# Patient Record
Sex: Female | Born: 1945 | Race: White | Hispanic: No | Smoking: Former smoker
Health system: Southern US, Community
[De-identification: ages and names within clinical notes are randomized; demographics above are authoritative.]

## PROBLEM LIST (undated history)

## (undated) DIAGNOSIS — C44329 Squamous cell carcinoma of skin of other parts of face: Secondary | ICD-10-CM

## (undated) DIAGNOSIS — D649 Anemia, unspecified: Secondary | ICD-10-CM

## (undated) DIAGNOSIS — M3500 Sicca syndrome, unspecified: Secondary | ICD-10-CM

## (undated) DIAGNOSIS — M8580 Other specified disorders of bone density and structure, unspecified site: Secondary | ICD-10-CM

## (undated) DIAGNOSIS — C4491 Basal cell carcinoma of skin, unspecified: Secondary | ICD-10-CM

## (undated) DIAGNOSIS — C50919 Malignant neoplasm of unspecified site of unspecified female breast: Secondary | ICD-10-CM

## (undated) HISTORY — DX: Sjogren syndrome, unspecified: M35.00

## (undated) HISTORY — DX: Basal cell carcinoma of skin, unspecified: C44.91

## (undated) HISTORY — PX: MASTECTOMY: SHX3

## (undated) HISTORY — PX: CATARACT EXTRACTION, BILATERAL: SHX1313

## (undated) HISTORY — DX: Anemia, unspecified: D64.9

## (undated) HISTORY — DX: Malignant neoplasm of unspecified site of unspecified female breast: C50.919

## (undated) HISTORY — DX: Squamous cell carcinoma of skin of other parts of face: C44.329

## (undated) HISTORY — DX: Other specified disorders of bone density and structure, unspecified site: M85.80

---

## 2018-10-08 LAB — HM DEXA SCAN

## 2021-06-14 ENCOUNTER — Ambulatory Visit (INDEPENDENT_AMBULATORY_CARE_PROVIDER_SITE_OTHER): Payer: Medicare Other | Admitting: Internal Medicine

## 2021-06-14 ENCOUNTER — Ambulatory Visit (INDEPENDENT_AMBULATORY_CARE_PROVIDER_SITE_OTHER): Payer: Medicare Other

## 2021-06-14 ENCOUNTER — Encounter: Payer: Self-pay | Admitting: Internal Medicine

## 2021-06-14 ENCOUNTER — Encounter (INDEPENDENT_AMBULATORY_CARE_PROVIDER_SITE_OTHER): Payer: Self-pay

## 2021-06-14 ENCOUNTER — Other Ambulatory Visit: Payer: Self-pay

## 2021-06-14 VITALS — BP 144/70 | HR 78 | Temp 98.5°F | Ht 69.25 in | Wt 122.6 lb

## 2021-06-14 DIAGNOSIS — I10 Essential (primary) hypertension: Secondary | ICD-10-CM | POA: Diagnosis not present

## 2021-06-14 DIAGNOSIS — D692 Other nonthrombocytopenic purpura: Secondary | ICD-10-CM | POA: Diagnosis not present

## 2021-06-14 DIAGNOSIS — R778 Other specified abnormalities of plasma proteins: Secondary | ICD-10-CM

## 2021-06-14 DIAGNOSIS — R5382 Chronic fatigue, unspecified: Secondary | ICD-10-CM | POA: Insufficient documentation

## 2021-06-14 DIAGNOSIS — R058 Other specified cough: Secondary | ICD-10-CM | POA: Insufficient documentation

## 2021-06-14 DIAGNOSIS — R0989 Other specified symptoms and signs involving the circulatory and respiratory systems: Secondary | ICD-10-CM | POA: Insufficient documentation

## 2021-06-14 DIAGNOSIS — J22 Unspecified acute lower respiratory infection: Secondary | ICD-10-CM | POA: Insufficient documentation

## 2021-06-14 DIAGNOSIS — R911 Solitary pulmonary nodule: Secondary | ICD-10-CM

## 2021-06-14 LAB — URINALYSIS, ROUTINE W REFLEX MICROSCOPIC
Bilirubin Urine: NEGATIVE
Hgb urine dipstick: NEGATIVE
Ketones, ur: NEGATIVE
Leukocytes,Ua: NEGATIVE
Nitrite: NEGATIVE
RBC / HPF: NONE SEEN (ref 0–?)
Specific Gravity, Urine: <=1.005 — AB (ref 1.000–1.030)
Total Protein, Urine: NEGATIVE
Urine Glucose: NEGATIVE
Urobilinogen, UA: 0.2 (ref 0.0–1.0)
WBC, UA: NONE SEEN (ref 0–?)
pH: 6.5 (ref 5.0–8.0)

## 2021-06-14 LAB — HEPATIC FUNCTION PANEL
ALT: 19 U/L (ref 0–35)
AST: 33 U/L (ref 0–37)
Albumin: 3.5 g/dL (ref 3.5–5.2)
Alkaline Phosphatase: 54 U/L (ref 39–117)
Bilirubin, Direct: 0.1 mg/dL (ref 0.0–0.3)
Total Bilirubin: 0.4 mg/dL (ref 0.2–1.2)
Total Protein: 9.7 g/dL — ABNORMAL HIGH (ref 6.0–8.3)

## 2021-06-14 LAB — BASIC METABOLIC PANEL
BUN: 15 mg/dL (ref 6–23)
CO2: 30 mEq/L (ref 19–32)
Calcium: 9.6 mg/dL (ref 8.4–10.5)
Chloride: 94 mEq/L — ABNORMAL LOW (ref 96–112)
Creatinine, Ser: 0.72 mg/dL (ref 0.40–1.20)
GFR: 82.71 mL/min (ref >60.00)
Glucose, Bld: 92 mg/dL (ref 70–99)
Potassium: 4.3 mEq/L (ref 3.5–5.1)
Sodium: 129 mEq/L — ABNORMAL LOW (ref 135–145)

## 2021-06-14 LAB — CBC WITH DIFFERENTIAL/PLATELET
Basophils Absolute: 0 10*3/uL (ref 0.0–0.1)
Basophils Relative: 0.7 % (ref 0.0–3.0)
Eosinophils Absolute: 0 10*3/uL (ref 0.0–0.7)
Eosinophils Relative: 0.8 % (ref 0.0–5.0)
HCT: 31.7 % — ABNORMAL LOW (ref 36.0–46.0)
Hemoglobin: 10.5 g/dL — ABNORMAL LOW (ref 12.0–15.0)
Lymphocytes Relative: 19 % (ref 12.0–46.0)
Lymphs Abs: 1 10*3/uL (ref 0.7–4.0)
MCHC: 33.3 g/dL (ref 30.0–36.0)
MCV: 93.2 fl (ref 78.0–100.0)
Monocytes Absolute: 0.4 10*3/uL (ref 0.1–1.0)
Monocytes Relative: 7 % (ref 3.0–12.0)
Neutro Abs: 3.9 10*3/uL (ref 1.4–7.7)
Neutrophils Relative %: 72.5 % (ref 43.0–77.0)
Platelets: 295 10*3/uL (ref 150.0–400.0)
RBC: 3.4 Mil/uL — ABNORMAL LOW (ref 3.87–5.11)
RDW: 14.1 % (ref 11.5–15.5)
WBC: 5.4 10*3/uL (ref 4.0–10.5)

## 2021-06-14 LAB — APTT: aPTT: 27.8 s (ref 23.4–32.7)

## 2021-06-14 LAB — TSH: TSH: 2 u[IU]/mL (ref 0.35–5.50)

## 2021-06-14 LAB — PROTIME-INR
INR: 1.1 ratio — ABNORMAL HIGH (ref 0.8–1.0)
Prothrombin Time: 11.6 s (ref 9.6–13.1)

## 2021-06-14 MED ORDER — AMOXICILLIN-POT CLAVULANATE 875-125 MG PO TABS: 1.0000 | ORAL_TABLET | Freq: Two times a day (BID) | ORAL | 0 refills | Status: AC

## 2021-06-14 NOTE — Progress Notes (Signed)
ppur  Subjective:  Patient ID: April Howell, female    DOB: Dec 29, 1945  Age: 75 y.o. MRN: [id number]  CC: Cough  This visit occurred during the SARS-CoV-2 public health emergency.  Safety protocols were in place, including screening questions prior to the visit, additional usage of staff PPE, and extensive cleaning of exam room while observing appropriate contact time as indicated for disinfecting solutions.    HPI April Howell presents for establishing.  She recently moved here from [place].  She complains of a 104-monthhistory of cough that is intermittently productive of green phlegm.  She describes being seen in an urgent care center 3 months ago and was prescribed steroids which were not helpful.  She also complains of fatigue and weight loss.   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   She has a past surgical history that includes Mastectomy (Right).   Her family history includes Basal cell carcinoma in her sister; Breast cancer in her sister; Heart attack in her father; Heart disease in her father; Hodgkin's lymphoma in her sister; Multiple sclerosis in her mother.She reports that she quit smoking about 50 years ago. Her smoking use included cigarettes. She has never used smokeless tobacco. She reports that she does not currently use alcohol after a past usage of about 7.0 standard drinks per week. She reports that she does not use drugs.  Outpatient Medications Prior to Visit  Medication Sig Dispense Refill   CALCIUM PO Take 650 mg by mouth daily.     Ferrous Sulfate Dried (SLOW RELEASE IRON) 45 MG TBCR Take by mouth.     Multiple Vitamin (MULTIVITAMIN) tablet Take 1 tablet by mouth daily.     Omega-3 Fatty Acids (FISH OIL) 1000 MG CAPS Take by mouth.     No facility-administered medications prior to visit.    ROS Review of Systems  Constitutional:  Positive for fatigue and  unexpected weight change. Negative for chills, diaphoresis and fever.  HENT: Negative.    Eyes: Negative.   Respiratory:  Positive for cough. Negative for chest tightness, shortness of breath and wheezing.   Cardiovascular:  Negative for chest pain, palpitations and leg swelling.  Gastrointestinal:  Negative for abdominal pain, constipation, diarrhea, nausea and vomiting.  Endocrine: Negative.   Genitourinary:  Negative for decreased urine volume and difficulty urinating.  Musculoskeletal:  Positive for arthralgias.  Skin: Negative.   Neurological: Negative.  Negative for dizziness and weakness.  Hematological:  Negative for adenopathy. Bruises/bleeds easily.  Psychiatric/Behavioral: Negative.     Objective:  BP (!) 144/70 (BP Location: Right Arm, Patient Position: Sitting, Cuff Size: Large)   Pulse 78   Temp 98.5 F (36.9 C) (Oral)   Ht 5' 9.25" (1.759 m)   Wt 122 lb 9.6 oz (55.6 kg)   SpO2 99%   BMI 17.97 kg/m   Physical Exam Vitals reviewed.  Constitutional:      Appearance: She is ill-appearing. She is not diaphoretic.  HENT:     Nose: Nose normal.     Mouth/Throat:     Mouth: Mucous membranes are moist. Mucous membranes are pale.  Eyes:     General: No scleral icterus.    Conjunctiva/sclera: Conjunctivae normal.  Cardiovascular:     Rate and Rhythm: Normal rate and regular rhythm.     Heart sounds: No murmur heard. Pulmonary:     Effort: Pulmonary effort is normal.  Breath sounds: No stridor. No wheezing, rhonchi or rales.  Abdominal:     General: Abdomen is flat. Bowel sounds are normal. There is no abdominal bruit.     Palpations: There is no mass.     Tenderness: There is no abdominal tenderness. There is no guarding.     Hernia: No hernia is present.     Comments: The aortic pulse is palpable but there is no bruit.  Musculoskeletal:        General: Normal range of motion.     Cervical back: Neck supple.     Right lower leg: No edema.     Left lower leg:  No edema.  Lymphadenopathy:     Cervical: No cervical adenopathy.  Skin:    General: Skin is warm and dry.     Coloration: Skin is pale.     Comments: Over both anterior lower legs there are scattered areas of nonpalpable petechiae and purpura  Neurological:     General: No focal deficit present.     Mental Status: She is alert.  Psychiatric:        Mood and Affect: Mood normal.    Lab Results  Component Value Date   WBC 5.4 06/14/2021   HGB 10.5 (L) 06/14/2021   HCT 31.7 (L) 06/14/2021   PLT 295.0 06/14/2021   GLUCOSE 92 06/14/2021   ALT 19 06/14/2021   AST 33 06/14/2021   NA 129 (L) 06/14/2021   K 4.3 06/14/2021   CL 94 (L) 06/14/2021   CREATININE 0.72 06/14/2021   BUN 15 06/14/2021   CO2 30 06/14/2021   TSH 2.00 06/14/2021   INR 1.1 (H) 06/14/2021    DG Chest 2 View  Result Date: 06/14/2021 CLINICAL DATA:  Productive cough for 3 months. EXAM: CHEST - 2 VIEW COMPARISON:  None. FINDINGS: The heart size and mediastinal contours are within normal limits. No pneumothorax or pleural effusion is noted. Hyperexpansion of the lungs is noted. Mild left lingular density is noted concerning for possible scarring or inflammation. Irregular nodular density is seen in right upper lobe. The visualized skeletal structures are unremarkable. IMPRESSION: Hyperexpansion of the lungs. Mild left lingular opacity is noted concerning for possible scarring or inflammation. Irregular nodular density is noted in right upper lobe; CT scan of the chest is recommended for further evaluation. Electronically Signed   By: Marijo Conception M.D.   On: 06/14/2021 18:29      Assessment & Plan:   April Howell was seen today for cough.  Diagnoses and all orders for this visit:  Purpura ([place]) -     CBC with Differential/Platelet; Future -     Protime-INR; Future -     APTT; Future -     APTT -     Protime-INR -     CBC with Differential/Platelet  Chronic fatigue- Her labs are significant for anemia.  I have  asked her to return to have this evaluated further. -     CBC with Differential/Platelet; Future -     Basic metabolic panel; Future -     TSH; Future -     TSH -     Basic metabolic panel -     CBC with Differential/Platelet  Primary hypertension- Antihypertensive therapy is not indicated. -     CBC with Differential/Platelet; Future -     Basic metabolic panel; Future -     TSH; Future -     Urinalysis, Routine w reflex microscopic; Future -  Hepatic function panel; Future -     Hepatic function panel -     Urinalysis, Routine w reflex microscopic -     TSH -     Basic metabolic panel -     CBC with Differential/Platelet  Cough productive of purulent sputum -     DG Chest 2 View; Future -     CT Chest W Contrast  Abdominal bruit -     Cancel: VAS [place] AAA DUPLEX; Future  Elevated total protein- She has symptoms, an exam, and the labs that are concerning for malignancy. -     Ambulatory referral to Hematology / Oncology -     CT Chest W Contrast  LRTI (lower respiratory tract infection)- Chest x-ray is suspicious for left pneumonia.  Will treat with Augmentin. -     amoxicillin-clavulanate (AUGMENTIN) 875-125 MG tablet; Take 1 tablet by mouth 2 (two) times daily for 10 days.  Pulse visible in abdominal aorta  Nodule of upper lobe of right lung- I recommended that she undergo a CT with contrast to screen for malignancy. -     CT Chest W Contrast  I am having [facility] start on amoxicillin-clavulanate. I am also having her maintain her CALCIUM PO, Fish Oil, multivitamin, and Slow Release Iron.  Meds ordered this encounter  Medications   amoxicillin-clavulanate (AUGMENTIN) 875-125 MG tablet    Sig: Take 1 tablet by mouth 2 (two) times daily for 10 days.    Dispense:  20 tablet    Refill:  0      Follow-up: Return in about 3 months (around 09/14/2021).  Scarlette Calico, MD

## 2021-06-14 NOTE — Patient Instructions (Signed)

## 2021-06-15 ENCOUNTER — Telehealth: Payer: Self-pay | Admitting: Hematology and Oncology

## 2021-06-15 DIAGNOSIS — R0989 Other specified symptoms and signs involving the circulatory and respiratory systems: Secondary | ICD-10-CM | POA: Insufficient documentation

## 2021-06-15 DIAGNOSIS — R911 Solitary pulmonary nodule: Secondary | ICD-10-CM | POA: Insufficient documentation

## 2021-06-15 NOTE — Telephone Encounter (Signed)
Received a new hem referral from Dr. Ronnald Ramp for Elevated total protein. Pt has been cld and scheduled to see Dr. Lorenso Courier on 9/2 at 1pm. Pt aware to arrive 15 minutes early.

## 2021-06-17 ENCOUNTER — Encounter: Payer: Self-pay | Admitting: Internal Medicine

## 2021-06-19 ENCOUNTER — Other Ambulatory Visit: Payer: Self-pay | Admitting: Internal Medicine

## 2021-06-19 ENCOUNTER — Telehealth: Payer: Self-pay | Admitting: Internal Medicine

## 2021-06-19 DIAGNOSIS — R778 Other specified abnormalities of plasma proteins: Secondary | ICD-10-CM

## 2021-06-19 DIAGNOSIS — D539 Nutritional anemia, unspecified: Secondary | ICD-10-CM | POA: Insufficient documentation

## 2021-06-19 NOTE — Telephone Encounter (Signed)
Provider sent patient mychart message advising patient to come in sometime this week to be screened for vitamin deficiencies but no orders are showing in patient chart  Please advise patient when she can come in to have test done

## 2021-06-20 ENCOUNTER — Other Ambulatory Visit (INDEPENDENT_AMBULATORY_CARE_PROVIDER_SITE_OTHER): Payer: Medicare Other

## 2021-06-20 DIAGNOSIS — D539 Nutritional anemia, unspecified: Secondary | ICD-10-CM | POA: Diagnosis not present

## 2021-06-20 DIAGNOSIS — R778 Other specified abnormalities of plasma proteins: Secondary | ICD-10-CM

## 2021-06-20 LAB — CBC WITH DIFFERENTIAL/PLATELET
Basophils Absolute: 0 10*3/uL (ref 0.0–0.1)
Basophils Relative: 0.7 % (ref 0.0–3.0)
Eosinophils Absolute: 0 10*3/uL (ref 0.0–0.7)
Eosinophils Relative: 0.8 % (ref 0.0–5.0)
HCT: 32.7 % — ABNORMAL LOW (ref 36.0–46.0)
Hemoglobin: 10.9 g/dL — ABNORMAL LOW (ref 12.0–15.0)
Lymphocytes Relative: 23.6 % (ref 12.0–46.0)
Lymphs Abs: 1 10*3/uL (ref 0.7–4.0)
MCHC: 33.3 g/dL (ref 30.0–36.0)
MCV: 92.6 fl (ref 78.0–100.0)
Monocytes Absolute: 0.3 10*3/uL (ref 0.1–1.0)
Monocytes Relative: 8 % (ref 3.0–12.0)
Neutro Abs: 2.7 10*3/uL (ref 1.4–7.7)
Neutrophils Relative %: 66.9 % (ref 43.0–77.0)
Platelets: 291 10*3/uL (ref 150.0–400.0)
RBC: 3.54 Mil/uL — ABNORMAL LOW (ref 3.87–5.11)
RDW: 13.8 % (ref 11.5–15.5)
WBC: 4 10*3/uL (ref 4.0–10.5)

## 2021-06-20 LAB — VITAMIN B12: Vitamin B-12: 881 pg/mL (ref 211–911)

## 2021-06-20 LAB — IBC + FERRITIN
Ferritin: 33.3 ng/mL (ref 10.0–291.0)
Iron: 114 ug/dL (ref 42–145)
Saturation Ratios: 30.4 % (ref 20.0–50.0)
TIBC: 375.2 ug/dL (ref 250.0–450.0)
Transferrin: 268 mg/dL (ref 212.0–360.0)

## 2021-06-20 LAB — FOLATE: Folate: >24.4 ng/mL (ref >5.9)

## 2021-06-20 NOTE — Telephone Encounter (Signed)
Pt has been informed that labs were entered

## 2021-06-22 ENCOUNTER — Inpatient Hospital Stay: Payer: Medicare Other | Attending: Hematology and Oncology | Admitting: Hematology and Oncology

## 2021-06-22 ENCOUNTER — Inpatient Hospital Stay: Payer: Medicare Other

## 2021-06-22 ENCOUNTER — Other Ambulatory Visit: Payer: Self-pay

## 2021-06-22 VITALS — BP 139/68 | HR 76 | Temp 96.8°F | Resp 18 | Wt 122.6 lb

## 2021-06-22 DIAGNOSIS — Z803 Family history of malignant neoplasm of breast: Secondary | ICD-10-CM | POA: Diagnosis not present

## 2021-06-22 DIAGNOSIS — Z87891 Personal history of nicotine dependence: Secondary | ICD-10-CM | POA: Insufficient documentation

## 2021-06-22 DIAGNOSIS — Z85828 Personal history of other malignant neoplasm of skin: Secondary | ICD-10-CM | POA: Diagnosis not present

## 2021-06-22 DIAGNOSIS — R059 Cough, unspecified: Secondary | ICD-10-CM | POA: Insufficient documentation

## 2021-06-22 DIAGNOSIS — E8809 Other disorders of plasma-protein metabolism, not elsewhere classified: Secondary | ICD-10-CM | POA: Insufficient documentation

## 2021-06-22 DIAGNOSIS — R918 Other nonspecific abnormal finding of lung field: Secondary | ICD-10-CM | POA: Diagnosis not present

## 2021-06-22 DIAGNOSIS — M8580 Other specified disorders of bone density and structure, unspecified site: Secondary | ICD-10-CM | POA: Insufficient documentation

## 2021-06-22 DIAGNOSIS — Z9012 Acquired absence of left breast and nipple: Secondary | ICD-10-CM | POA: Diagnosis not present

## 2021-06-22 DIAGNOSIS — Z853 Personal history of malignant neoplasm of breast: Secondary | ICD-10-CM | POA: Diagnosis not present

## 2021-06-22 DIAGNOSIS — D500 Iron deficiency anemia secondary to blood loss (chronic): Secondary | ICD-10-CM

## 2021-06-22 DIAGNOSIS — Z806 Family history of leukemia: Secondary | ICD-10-CM | POA: Diagnosis not present

## 2021-06-22 DIAGNOSIS — D509 Iron deficiency anemia, unspecified: Secondary | ICD-10-CM | POA: Insufficient documentation

## 2021-06-22 DIAGNOSIS — R778 Other specified abnormalities of plasma proteins: Secondary | ICD-10-CM

## 2021-06-22 DIAGNOSIS — Z0000 Encounter for general adult medical examination without abnormal findings: Secondary | ICD-10-CM

## 2021-06-22 LAB — LACTATE DEHYDROGENASE: LDH: 123 U/L (ref 98–192)

## 2021-06-22 LAB — CMP (CANCER CENTER ONLY)
ALT: 21 U/L (ref 0–44)
AST: 36 U/L (ref 15–41)
Albumin: 3.4 g/dL — ABNORMAL LOW (ref 3.5–5.0)
Alkaline Phosphatase: 62 U/L (ref 38–126)
Anion gap: 8 (ref 5–15)
BUN: 16 mg/dL (ref 8–23)
CO2: 26 mmol/L (ref 22–32)
Calcium: 9.4 mg/dL (ref 8.9–10.3)
Chloride: 97 mmol/L — ABNORMAL LOW (ref 98–111)
Creatinine: 0.77 mg/dL (ref 0.44–1.00)
GFR, Estimated: >60 mL/min (ref >60)
Glucose, Bld: 98 mg/dL (ref 70–99)
Potassium: 4.2 mmol/L (ref 3.5–5.1)
Sodium: 131 mmol/L — ABNORMAL LOW (ref 135–145)
Total Bilirubin: 0.4 mg/dL (ref 0.3–1.2)
Total Protein: 10.3 g/dL — ABNORMAL HIGH (ref 6.5–8.1)

## 2021-06-22 LAB — CBC WITH DIFFERENTIAL (CANCER CENTER ONLY)
Abs Immature Granulocytes: 0.02 10*3/uL (ref 0.00–0.07)
Basophils Absolute: 0 10*3/uL (ref 0.0–0.1)
Basophils Relative: 1 %
Eosinophils Absolute: 0.1 10*3/uL (ref 0.0–0.5)
Eosinophils Relative: 1 %
HCT: 32.4 % — ABNORMAL LOW (ref 36.0–46.0)
Hemoglobin: 10.8 g/dL — ABNORMAL LOW (ref 12.0–15.0)
Immature Granulocytes: 0 %
Lymphocytes Relative: 21 %
Lymphs Abs: 1.1 10*3/uL (ref 0.7–4.0)
MCH: 31 pg (ref 26.0–34.0)
MCHC: 33.3 g/dL (ref 30.0–36.0)
MCV: 93.1 fL (ref 80.0–100.0)
Monocytes Absolute: 0.4 10*3/uL (ref 0.1–1.0)
Monocytes Relative: 7 %
Neutro Abs: 3.7 10*3/uL (ref 1.7–7.7)
Neutrophils Relative %: 70 %
Platelet Count: 253 10*3/uL (ref 150–400)
RBC: 3.48 MIL/uL — ABNORMAL LOW (ref 3.87–5.11)
RDW: 14.1 % (ref 11.5–15.5)
WBC Count: 5.3 10*3/uL (ref 4.0–10.5)
nRBC: 0 % (ref 0.0–0.2)

## 2021-06-22 LAB — RETIC PANEL
Immature Retic Fract: 9.8 % (ref 2.3–15.9)
RBC.: 3.54 MIL/uL — ABNORMAL LOW (ref 3.87–5.11)
Retic Count, Absolute: 52.4 10*3/uL (ref 19.0–186.0)
Retic Ct Pct: 1.5 % (ref 0.4–3.1)
Reticulocyte Hemoglobin: 34.3 pg (ref >27.9)

## 2021-06-22 LAB — IRON AND TIBC
Iron: 93 ug/dL (ref 41–142)
Saturation Ratios: 26 % (ref 21–57)
TIBC: 360 ug/dL (ref 236–444)
UIBC: 267 ug/dL (ref 120–384)

## 2021-06-22 LAB — FERRITIN: Ferritin: 19 ng/mL (ref 11–307)

## 2021-06-23 LAB — BETA 2 MICROGLOBULIN, SERUM: Beta-2 Microglobulin: 2.9 mg/L — ABNORMAL HIGH (ref 0.6–2.4)

## 2021-06-24 LAB — PROTEIN ELECTROPHORESIS, SERUM
Albumin ELP: 3.6 g/dL — ABNORMAL LOW (ref 3.8–4.8)
Alpha 1: 0.3 g/dL (ref 0.2–0.3)
Alpha 2: 0.8 g/dL (ref 0.5–0.9)
Beta 2: 0.6 g/dL — ABNORMAL HIGH (ref 0.2–0.5)
Beta Globulin: 0.5 g/dL (ref 0.4–0.6)
Gamma Globulin: 3.6 g/dL — ABNORMAL HIGH (ref 0.8–1.7)
Total Protein: 9.3 g/dL — ABNORMAL HIGH (ref 6.1–8.1)

## 2021-06-24 LAB — RETICULOCYTES
ABS Retic: 50680 cells/uL (ref 20000–80000)
Retic Ct Pct: 1.4 %

## 2021-06-24 LAB — VITAMIN B1: Vitamin B1 (Thiamine): 31 nmol/L — ABNORMAL HIGH (ref 8–30)

## 2021-06-26 LAB — KAPPA/LAMBDA LIGHT CHAINS
Kappa free light chain: 116 mg/L — ABNORMAL HIGH (ref 3.3–19.4)
Kappa, lambda light chain ratio: 1.69 — ABNORMAL HIGH (ref 0.26–1.65)
Lambda free light chains: 68.8 mg/L — ABNORMAL HIGH (ref 5.7–26.3)

## 2021-06-27 LAB — MULTIPLE MYELOMA PANEL, SERUM
Albumin SerPl Elph-Mcnc: 3.4 g/dL (ref 2.9–4.4)
Albumin/Glob SerPl: 0.6 — ABNORMAL LOW (ref 0.7–1.7)
Alpha 1: 0.2 g/dL (ref 0.0–0.4)
Alpha2 Glob SerPl Elph-Mcnc: 0.7 g/dL (ref 0.4–1.0)
B-Globulin SerPl Elph-Mcnc: 1.3 g/dL (ref 0.7–1.3)
Gamma Glob SerPl Elph-Mcnc: 3.8 g/dL — ABNORMAL HIGH (ref 0.4–1.8)
Globulin, Total: 5.9 g/dL — ABNORMAL HIGH (ref 2.2–3.9)
IgA: 768 mg/dL — ABNORMAL HIGH (ref 64–422)
IgG (Immunoglobin G), Serum: 3701 mg/dL — ABNORMAL HIGH (ref 586–1602)
IgM (Immunoglobulin M), Srm: 83 mg/dL (ref 26–217)
Total Protein ELP: 9.3 g/dL — ABNORMAL HIGH (ref 6.0–8.5)

## 2021-07-03 ENCOUNTER — Telehealth: Payer: Self-pay | Admitting: Hematology and Oncology

## 2021-07-03 NOTE — Progress Notes (Signed)
[place] [phone]   Fax:([phone]) [place] NOTE  Patient Care Team: Janith Lima, MD as PCP - General (Internal Medicine)  Hematological/Oncological History # Lung Nodules  06/14/2021: CXR showed irregular nodular density is noted in right upper lobe during workup for productive cough x 3 months.  06/22/2021: establish care with Dr. Lorenso Courier   CHIEF COMPLAINTS/PURPOSE OF CONSULTATION:  "Lungs Nodules/Elevated Serum Protein/Normocytic Anemia "  HISTORY OF PRESENTING ILLNESS:  April Howell 75 y.o. female with medical history significant for basal cell carcinoma, breast cancer, and osteopenia who presents for evaluation of a lung nodule and elevated serum protein.   On review of the previous records April Howell recently moved from [place] and establish care with Dr. Ronnald Ramp on 06/14/2021.  At that time he ordered an x-ray of her lungs to better assess why she had a chronic cough.  During that x-ray she was noted to have an irregular nodular density in the right upper lobe.  Additionally her blood work showed elevation in serum protein.  Due to concern for these findings the patient was referred to oncology for further evaluation and management.  On exam today April Howell is accompanied by her husband.  She reports that she has had this cough "going on for a while".  She notes that when she takes a big deep breath she tends to cough.  Her energy levels have been low, mostly over the last 3 to 6 months.  She has had episodes of bronchitis and was prescribed amoxicillin which appears to be helping.  On further discussion she was a smoker having quit in 1972.  She smoked 1 pack/day for 40 years.  She notes that she has a glass of wine nightly.  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  Her family history is remarkable for Hodgkin's lymphoma and breast cancer in her sister.  She notes that her brother also had a history of basal cell  cancer.  Her mother had MS and died at the age 12 and her father passed away of a myocardial infarction.  She currently denies any fevers, chills, sweats, nausea, vomiting or diarrhea.  She does endorse 5 to 10 pound weight loss in the last year.  A full 10 point ROS is listed below.  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     SURGICAL HISTORY: Past Surgical History:  Procedure Laterality Date   MASTECTOMY Right     SOCIAL HISTORY: Social History   Socioeconomic History   Marital status: Married    Spouse name: Not on file   Number of children: Not on file   Years of education: Not on file   Highest education level: Not on file  Occupational History   Not on file  Tobacco Use   Smoking status: Former    Types: Cigarettes    Quit date: 48    Years since quitting: 50.7   Smokeless tobacco: Never  Vaping Use   Vaping Use: Not on file  Substance and Sexual Activity   Alcohol use: Not Currently    Alcohol/week: 7.0 standard drinks    Types: 7 Glasses of wine per week   Drug use: Never   Sexual activity: Yes    Partners: Male  Other Topics Concern   Not on  file  Social History Narrative   Not on file   Social Determinants of Health   Financial Resource Strain: Not on file  Food Insecurity: Not on file  Transportation Needs: Not on file  Physical Activity: Not on file  Stress: Not on file  Social Connections: Not on file  Intimate Partner Violence: Not on file    FAMILY HISTORY: Family History  Problem Relation Age of Onset   Multiple sclerosis Mother    Heart attack Father    Heart disease Father    Hodgkin's lymphoma Sister    Breast cancer Sister    Basal cell carcinoma Sister     ALLERGIES:  has No Known Allergies.  MEDICATIONS:  Current Outpatient Medications  Medication Sig Dispense Refill   CALCIUM PO Take 650 mg by  mouth daily.     Ferrous Sulfate Dried (SLOW RELEASE IRON) 45 MG TBCR Take by mouth.     Multiple Vitamin (MULTIVITAMIN) tablet Take 1 tablet by mouth daily.     Omega-3 Fatty Acids (FISH OIL) 1000 MG CAPS Take by mouth.     No current facility-administered medications for this visit.    REVIEW OF SYSTEMS:   Constitutional: ( - ) fevers, ( - )  chills , ( - ) night sweats Eyes: ( - ) blurriness of vision, ( - ) double vision, ( - ) watery eyes Ears, nose, mouth, throat, and face: ( - ) mucositis, ( - ) sore throat Respiratory: ( - ) cough, ( - ) dyspnea, ( - ) wheezes Cardiovascular: ( - ) palpitation, ( - ) chest discomfort, ( - ) lower extremity swelling Gastrointestinal:  ( - ) nausea, ( - ) heartburn, ( - ) change in bowel habits Skin: ( - ) abnormal skin rashes Lymphatics: ( - ) new lymphadenopathy, ( - ) easy bruising Neurological: ( - ) numbness, ( - ) tingling, ( - ) new weaknesses Behavioral/Psych: ( - ) mood change, ( - ) new changes  All other systems were reviewed with the patient and are negative.  PHYSICAL EXAMINATION:  Vitals:   06/22/21 1244  BP: 139/68  Pulse: 76  Resp: 18  Temp: (!) 96.8 F (36 C)  SpO2: 100%   Filed Weights   06/22/21 1244  Weight: 122 lb 9 oz (55.6 kg)    GENERAL: well appearing elderly female in no acute distress  SKIN: skin color, texture, turgor are normal, no rashes or significant lesions EYES: conjunctiva are Howell and non-injected, sclera clear LUNGS: clear to auscultation and percussion with normal breathing effort HEART: regular rate & rhythm and no murmurs and no lower extremity edema Musculoskeletal: no cyanosis of digits and no clubbing  PSYCH: alert & oriented x 3, fluent speech NEURO: no focal motor/sensory deficits  LABORATORY DATA:  I have reviewed the data as listed CBC Latest Ref Rng & Units 06/22/2021 06/20/2021 06/14/2021  WBC 4.0 - 10.5 K/uL 5.3 4.0 5.4  Hemoglobin 12.0 - 15.0 g/dL 10.8(L) 10.9(L) 10.5(L)   Hematocrit 36.0 - 46.0 % 32.4(L) 32.7(L) 31.7(L)  Platelets 150 - 400 K/uL 253 291.0 295.0    CMP Latest Ref Rng & Units 06/22/2021 06/20/2021 06/14/2021  Glucose 70 - 99 mg/dL 98 - 92  BUN 8 - 23 mg/dL 16 - 15  Creatinine 0.44 - 1.00 mg/dL 0.77 - 0.72  Sodium 135 - 145 mmol/L 131(L) - 129(L)  Potassium 3.5 - 5.1 mmol/L 4.2 - 4.3  Chloride 98 - 111 mmol/L 97(L) - 94(L)  CO2 22 - 32 mmol/L 26 - 30  Calcium 8.9 - 10.3 mg/dL 9.4 - 9.6  Total Protein 6.5 - 8.1 g/dL 10.3(H) 9.3(H) 9.7(H)  Total Bilirubin 0.3 - 1.2 mg/dL 0.4 - 0.4  Alkaline Phos 38 - 126 U/L 62 - 54  AST 15 - 41 U/L 36 - 33  ALT 0 - 44 U/L 21 - 19    RADIOGRAPHIC STUDIES: DG Chest 2 View  Result Date: 06/14/2021 CLINICAL DATA:  Productive cough for 3 months. EXAM: CHEST - 2 VIEW COMPARISON:  None. FINDINGS: The heart size and mediastinal contours are within normal limits. No pneumothorax or pleural effusion is noted. Hyperexpansion of the lungs is noted. Mild left lingular density is noted concerning for possible scarring or inflammation. Irregular nodular density is seen in right upper lobe. The visualized skeletal structures are unremarkable. IMPRESSION: Hyperexpansion of the lungs. Mild left lingular opacity is noted concerning for possible scarring or inflammation. Irregular nodular density is noted in right upper lobe; CT scan of the chest is recommended for further evaluation. Electronically Signed   By: Marijo Conception M.D.   On: 06/14/2021 18:29    ASSESSMENT & PLAN Leita Lindbloom 75 y.o. female with medical history significant for basal cell carcinoma, breast cancer, and osteopenia who presents for evaluation of a lung nodule and elevated serum protein.   After review of the labs, review of the records, and discussion with the patient the patients findings are most consistent with 3 distinct problems.  She is noted to have a lung nodule on x-ray and is currently scheduled for a CT scan of the chest on 07/17/2021.  We will  follow-up the results of the CT scan.  Additionally she is noted to have elevated serum protein.  There is no evidence of a monoclonal component, however we will do a full work-up in order to rule out a possible MGUS.  Additionally patient is noted to have normocytic anemia with no clear etiology.  We will begin nutritional work-up to assess for a possible cause.  #Lung Nodule --findings initially on Xray, await the results of the CT scan on 07/17/2021 --continue to monitor  #Elevated Serum Protein -- no evidence of a monoclonal protein on initial labs --today will order an SPEP, UPEP, SFLC and beta 2 microglobulin --additionally will collect new baseline CBC, CMP, and LDH --will consider the need for a bone survey pending the above results --continue to monitor  #Normocytic Anemia -- Likely represents iron deficiency anemia. --Encourage patient to continue p.o. iron therapy. --Awaiting the results of the iron panel and ferritin. --Continue to monitor.   Orders Placed This Encounter  Procedures   CBC with Differential ([facility] Only)    Standing Status:   Future    Number of Occurrences:   1    Standing Expiration Date:   06/22/2022   CMP ([facility] only)    Standing Status:   Future    Number of Occurrences:   1    Standing Expiration Date:   06/22/2022   Lactate dehydrogenase (LDH)    Standing Status:   Future    Number of Occurrences:   1    Standing Expiration Date:   06/22/2022   Multiple Myeloma Panel (SPEP&IFE w/QIG)    Standing Status:   Future    Number of Occurrences:   1    Standing Expiration Date:   06/22/2022   Kappa/lambda light chains    Standing Status:   Future  Number of Occurrences:   1    Standing Expiration Date:   06/22/2022   Beta 2 microglobulin    Standing Status:   Future    Number of Occurrences:   1    Standing Expiration Date:   06/22/2022   Iron and TIBC    Standing Status:   Future    Number of Occurrences:   1    Standing Expiration  Date:   06/22/2022   Ferritin    Standing Status:   Future    Number of Occurrences:   1    Standing Expiration Date:   06/22/2022   Retic Panel    Standing Status:   Future    Number of Occurrences:   1    Standing Expiration Date:   06/22/2022    All questions were answered. The patient knows to call the clinic with any problems, questions or concerns.  A total of more than 60 minutes were spent on this encounter with face-to-face time and non-face-to-face time, including preparing to see the patient, ordering tests and/or medications, counseling the patient and coordination of care as outlined above.   Ledell Peoples, MD Department of Hematology/Oncology [place] [facility] Phone: [phone] Pager: [phone] Email: [email or web address]  07/03/2021 12:52 PM

## 2021-07-03 NOTE — Telephone Encounter (Signed)
Scheduled per sch msg. Called and spoke with patient. Confirmed appt  

## 2021-07-17 ENCOUNTER — Other Ambulatory Visit: Payer: Self-pay

## 2021-07-17 ENCOUNTER — Ambulatory Visit
Admission: RE | Admit: 2021-07-17 | Discharge: 2021-07-17 | Disposition: A | Discharge status: Home / Self Care | Payer: Medicare Other | Source: Ambulatory Visit | Attending: Internal Medicine | Admitting: Internal Medicine

## 2021-07-17 MED ORDER — IOPAMIDOL (ISOVUE-300) INJECTION 61%
75.0000 mL | Freq: Once | INTRAVENOUS | Status: AC | PRN
  Administered 2021-07-17: 75 mL via INTRAVENOUS

## 2021-07-18 ENCOUNTER — Other Ambulatory Visit: Payer: Self-pay | Admitting: Internal Medicine

## 2021-07-18 ENCOUNTER — Encounter: Payer: Self-pay | Admitting: Internal Medicine

## 2021-07-18 DIAGNOSIS — A319 Mycobacterial infection, unspecified: Secondary | ICD-10-CM

## 2021-07-18 DIAGNOSIS — J158 Pneumonia due to other specified bacteria: Secondary | ICD-10-CM | POA: Insufficient documentation

## 2021-07-18 HISTORY — DX: Pneumonia due to other specified bacteria: J15.8

## 2021-07-18 HISTORY — DX: Mycobacterial infection, unspecified: A31.9

## 2021-08-09 ENCOUNTER — Encounter: Payer: Self-pay | Admitting: Pulmonary Disease

## 2021-08-09 ENCOUNTER — Other Ambulatory Visit: Payer: Self-pay

## 2021-08-09 ENCOUNTER — Ambulatory Visit (INDEPENDENT_AMBULATORY_CARE_PROVIDER_SITE_OTHER): Payer: Medicare Other | Admitting: Pulmonary Disease

## 2021-08-09 VITALS — BP 120/68 | HR 70 | Ht 69.25 in | Wt 124.2 lb

## 2021-08-09 DIAGNOSIS — J479 Bronchiectasis, uncomplicated: Secondary | ICD-10-CM

## 2021-08-09 NOTE — Patient Instructions (Addendum)
Use albuterol nebulizer treatment morning and evening followed by flutter valve therapy  We will check labs today to evaluate for Sjogren's syndrome.  We will provide you with sputum sample cups. Please bring back on 3 separate days and bring in sputum specimen within 4 hours of coughing up the sample.   We will discuss the culture results over the phone when available.

## 2021-08-09 NOTE — Progress Notes (Signed)
Synopsis: Referred in October 2022 for Mycobacterial Infection by Scarlette Calico, MD  Subjective:   PATIENT ID: April Howell GENDER: female DOB: January 15, 1946, MRN: [id number]  HPI  Chief Complaint  Patient presents with   Consult    Referred by PCP for productive cough for the past 6 months. Phlegm described as greenish, yellowish. Has been on several rounds of abx. Low energy levels.     April Howell is a 75 year old, former smoker who is referred to pulmonary clinic for concern of mycobacterial infection.   She reports having a cough since May 2022 that is productive with greenish sputum intermittently. She is also experiencing fatigue/lack of energy and has been losing weight. The cough occurs when lying flat. She has no night time awakenings due to cough. She denies wheezing. Prior to May she denies issues with cough. She may experience cough with eating certain dry foods but this is not very common occurrence.   She denies history of frequent sinus or respiratory infections. She denies joint aches or skin rashes. She does complain of dry eyes and dry mouth. She does have GERD symptoms occasionally. She complains of easy bruising. She was recently evaluated by Dr. Lorenso Courier for elevated total protein, note reviewed from 06/22/21. Her immunoglobulin levels were not low from this evaluation.   She is a former smoker and quit in 1972. She has history of breast cancer s/p right mastectomy and chemotherapy. She did not have radiation therapy.   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      Family History  Problem Relation Age of Onset   Multiple sclerosis Mother    Heart attack Father    Heart disease Father    Hodgkin's lymphoma Sister    Breast cancer Sister    Basal cell carcinoma Sister      Social History   Socioeconomic History   Marital status: Married    Spouse name: Not  on file   Number of children: Not on file   Years of education: Not on file   Highest education level: Not on file  Occupational History   Not on file  Tobacco Use   Smoking status: Former    Types: Cigarettes    Quit date: 96    Years since quitting: 50.8   Smokeless tobacco: Never  Vaping Use   Vaping Use: Not on file  Substance and Sexual Activity   Alcohol use: Not Currently    Alcohol/week: 7.0 standard drinks    Types: 7 Glasses of wine per week   Drug use: Never   Sexual activity: Yes    Partners: Male  Other Topics Concern   Not on file  Social History Narrative   Not on file   Social Determinants of Health   Financial Resource Strain: Not on file  Food Insecurity: Not on file  Transportation Needs: Not on file  Physical Activity: Not on file  Stress: Not on file  Social Connections: Not on file  Intimate Partner Violence: Not on file     No Known Allergies   Outpatient Medications Prior to Visit  Medication Sig Dispense Refill   CALCIUM PO Take 650 mg by mouth daily.     Ferrous Sulfate Dried (SLOW RELEASE IRON) 45 MG TBCR Take by mouth.     Multiple Vitamin (MULTIVITAMIN) tablet Take 1  tablet by mouth daily.     Omega-3 Fatty Acids (FISH OIL) 1000 MG CAPS Take by mouth.     No facility-administered medications prior to visit.    Review of Systems  Constitutional:  Positive for malaise/fatigue and weight loss. Negative for chills and fever.  HENT:  Negative for congestion, sinus pain and sore throat.   Eyes: Negative.   Respiratory:  Positive for cough and sputum production. Negative for hemoptysis, shortness of breath and wheezing.   Cardiovascular:  Negative for chest pain, palpitations, orthopnea, claudication and leg swelling.  Gastrointestinal:  Negative for abdominal pain, heartburn, nausea and vomiting.  Genitourinary: Negative.   Musculoskeletal:  Negative for joint pain and myalgias.  Skin:  Negative for rash.  Neurological:  Negative for  weakness.  Endo/Heme/Allergies: Negative.   Psychiatric/Behavioral: Negative.     Objective:   Vitals:   08/09/21 1030  BP: 120/68  Pulse: 70  SpO2: 99%  Weight: 124 lb 3.2 oz (56.3 kg)  Height: 5' 9.25" (1.759 m)     Physical Exam Constitutional:      General: She is not in acute distress.    Appearance: She is not ill-appearing.  HENT:     Head: Normocephalic and atraumatic.  Eyes:     General: No scleral icterus.    Conjunctiva/sclera: Conjunctivae normal.     Pupils: Pupils are equal, round, and reactive to light.  Cardiovascular:     Rate and Rhythm: Normal rate and regular rhythm.     Pulses: Normal pulses.     Heart sounds: Normal heart sounds. No murmur heard. Pulmonary:     Effort: Pulmonary effort is normal.     Breath sounds: Normal breath sounds. No wheezing, rhonchi or rales.  Abdominal:     General: Bowel sounds are normal.     Palpations: Abdomen is soft.  Musculoskeletal:     Right lower leg: No edema.     Left lower leg: No edema.  Lymphadenopathy:     Cervical: No cervical adenopathy.  Skin:    General: Skin is warm and dry.  Neurological:     General: No focal deficit present.     Mental Status: She is alert.  Psychiatric:        Mood and Affect: Mood normal.        Behavior: Behavior normal.        Thought Content: Thought content normal.        Judgment: Judgment normal.    CBC    Component Value Date/Time   WBC 5.3 06/22/2021 1353   WBC 4.0 06/20/2021 1355   RBC 3.48 (L) 06/22/2021 1353   RBC 3.54 (L) 06/22/2021 1352   HGB 10.8 (L) 06/22/2021 1353   HCT 32.4 (L) 06/22/2021 1353   PLT 253 06/22/2021 1353   MCV 93.1 06/22/2021 1353   MCH 31.0 06/22/2021 1353   MCHC 33.3 06/22/2021 1353   RDW 14.1 06/22/2021 1353   LYMPHSABS 1.1 06/22/2021 1353   MONOABS 0.4 06/22/2021 1353   EOSABS 0.1 06/22/2021 1353   BASOSABS 0.0 06/22/2021 1353   BMP Latest Ref Rng & Units 06/22/2021 06/14/2021  Glucose 70 - 99 mg/dL 98 92  BUN 8 - 23  mg/dL 16 15  Creatinine 0.44 - 1.00 mg/dL 0.77 0.72  Sodium 135 - 145 mmol/L 131(L) 129(L)  Potassium 3.5 - 5.1 mmol/L 4.2 4.3  Chloride 98 - 111 mmol/L 97(L) 94(L)  CO2 22 - 32 mmol/L 26 30  Calcium 8.9 - 10.3  mg/dL 9.4 9.6   Chest imaging CT Chest 07/17/21 1. Right middle lobe, lingula and bilateral lower lobe bronchiectasis, peribronchovascular nodularity, mucoid impaction and scattered volume loss, findings typical of mycobacterium avium complex. This likely accounts for the questioned abnormality in the right upper lobe on recent chest radiograph. 2. 3 mm right solid pulmonary nodule within the upper lobe. If patient is low risk for malignancy, no routine follow-up imaging is recommended; if patient is high risk for malignancy, a non-contrast Chest CT at 12 months is optional.  PFT: No flowsheet data found.  Labs: 06/22/21 IgG 3,701 IgM 83 IgA 768  Path:  Echo:  Heart Catheterization:    Assessment & Plan:   Bronchiectasis without complication (HCC) - Plan: Sjogrens syndrome-A extractable nuclear antibody, Sjogrens syndrome-B extractable nuclear antibody, ANA, Rheumatoid factor, Anti-Smith antibody, Anti-Smith antibody, Rheumatoid factor, ANA, Sjogrens syndrome-B extractable nuclear antibody, Sjogrens syndrome-A extractable nuclear antibody, ANCA Screen Reflex Titer, ANCA Screen Reflex Titer, Respiratory or Resp and Sputum Culture, AFB Culture & Smear, MYCOBACTERIA, CULTURE, WITH FLUOROCHROME SMEAR, Ambulatory Referral for DME, AFB Culture & Smear, CANCELED: ANCA screen with reflex titer, CANCELED: ANCA screen with reflex titer  Discussion: Shalisha Clausing is a 75 year old, former smoker who is referred to pulmonary clinic for concern of mycobacterial infection.   We reviewed her recent CT Chest scan from 07/17/21 which shows bronchiectasis of the right middle lobe, lingula and bilateral lower lobes. There are some airways with mucoid impaction. Her symptoms of fatigue, weight  loss and cough are likely related to her bronchiectasis. The etiology of the bronchiectasis is unknown at this time. She does not appear to have immunodeficiency as her immunoglobulin levels are normal to elevated and she does not have history concerning for recurrent sinopulmonary infections.  There is concern for possible dysphagia as she has trouble with dry foods.  Her history of GERD may also be contributing.  She also complains of dry eyes/dry mouth concerning for Sjogren's syndrome.  We will check inflammatory serologies today.  We discussed management of bronchiectasis and we have provided her with orders for nebulizer machine and albuterol nebulizer solution.  She is to use nebulizer treatments 2-3 times per day followed by flutter valve therapy for bronchial hygiene and mucus clearance.  We have also provided her with specimen cups to obtain 3 sputum samples on separate days to evaluate for mycobacterial infection and routine respiratory cultures.  We will hold off on antibiotic therapy at this time and await culture results.  Freda Jackson, MD [facility] Office: [phone]    Current Outpatient Medications:    CALCIUM PO, Take 650 mg by mouth daily., Disp: , Rfl:    Ferrous Sulfate Dried (SLOW RELEASE IRON) 45 MG TBCR, Take by mouth., Disp: , Rfl:    Multiple Vitamin (MULTIVITAMIN) tablet, Take 1 tablet by mouth daily., Disp: , Rfl:    Omega-3 Fatty Acids (FISH OIL) 1000 MG CAPS, Take by mouth., Disp: , Rfl:

## 2021-08-10 ENCOUNTER — Encounter: Payer: Self-pay | Admitting: Pulmonary Disease

## 2021-08-10 ENCOUNTER — Other Ambulatory Visit: Payer: Medicare Other

## 2021-08-10 DIAGNOSIS — J479 Bronchiectasis, uncomplicated: Secondary | ICD-10-CM

## 2021-08-10 LAB — ANCA SCREEN W REFLEX TITER: ANCA Screen: NEGATIVE

## 2021-08-13 ENCOUNTER — Other Ambulatory Visit: Payer: Self-pay

## 2021-08-13 ENCOUNTER — Other Ambulatory Visit: Payer: Medicare Other

## 2021-08-13 DIAGNOSIS — J479 Bronchiectasis, uncomplicated: Secondary | ICD-10-CM

## 2021-08-13 LAB — RHEUMATOID FACTOR: Rheumatoid fact SerPl-aCnc: 385 IU/mL — ABNORMAL HIGH (ref <14)

## 2021-08-13 LAB — ANA: Anti Nuclear Antibody (ANA): POSITIVE — AB

## 2021-08-13 LAB — ANTI-NUCLEAR AB-TITER (ANA TITER): ANA Titer 1: 1:1280 {titer} — ABNORMAL HIGH

## 2021-08-13 LAB — ANTI-SMITH ANTIBODY: ENA SM Ab Ser-aCnc: <1 AI

## 2021-08-13 LAB — SJOGRENS SYNDROME-A EXTRACTABLE NUCLEAR ANTIBODY: SSA (Ro) (ENA) Antibody, IgG: >8 AI — AB

## 2021-08-13 LAB — SJOGRENS SYNDROME-B EXTRACTABLE NUCLEAR ANTIBODY: SSB (La) (ENA) Antibody, IgG: >8 AI — AB

## 2021-08-15 ENCOUNTER — Telehealth: Payer: Self-pay | Admitting: Pulmonary Disease

## 2021-08-15 LAB — RESPIRATORY CULTURE OR RESPIRATORY AND SPUTUM CULTURE: MICRO NUMBER:: 12542745

## 2021-08-15 NOTE — Telephone Encounter (Signed)
Please let patient know that her lab work up has come back consistent with Sjogren's syndrome as she suspected. This is likely what has led to her bronchiectasis. We will place a referral to rheumatology for further evaluation and treatment if she is ok with that.  Her sputum cultures are growing 2 bacteria sensitive to Augmentin. Please send in a prescription for Augmentin 875mg  BID for 14 days. We are will waiting on the final mycobacteria culture results.  I hope all is going well with her nebulizer treatments and flutter valve.  Thanks, Wille Glaser

## 2021-08-20 NOTE — Telephone Encounter (Signed)
Spoke with pt and reviewed lab results as dictated by Dr. Erin Fulling. Reviewed Augmentin order and instructions with pt as well. Pt verified pharmacy and stated understanding. Medication order placed. Nothing further needed at this time.

## 2021-08-21 ENCOUNTER — Telehealth: Payer: Self-pay | Admitting: Pulmonary Disease

## 2021-08-21 ENCOUNTER — Other Ambulatory Visit: Payer: Self-pay

## 2021-08-21 DIAGNOSIS — J479 Bronchiectasis, uncomplicated: Secondary | ICD-10-CM

## 2021-08-21 MED ORDER — AMOXICILLIN-POT CLAVULANATE 875-125 MG PO TABS: 1.0000 | ORAL_TABLET | Freq: Two times a day (BID) | ORAL | 0 refills | Status: AC

## 2021-08-21 NOTE — Telephone Encounter (Signed)
Call made to pharmacy to ensure medication had not been sent. They have no record of the Augmentin. Script sent in.   LM with patient making her aware the medication has been sent in.

## 2021-08-31 ENCOUNTER — Telehealth: Payer: Self-pay | Admitting: Pulmonary Disease

## 2021-08-31 NOTE — Telephone Encounter (Signed)
Called [facility] and went straight to a voicemail- LMTCB

## 2021-08-31 NOTE — Telephone Encounter (Signed)
I have sent a community message over to [facility] to see if they can check on the status of the neb solution.

## 2021-09-03 NOTE — Telephone Encounter (Signed)
ATC x1.  LM to return call. 

## 2021-09-04 MED ORDER — ALBUTEROL SULFATE (2.5 MG/3ML) 0.083% IN NEBU: 2.5000 mg | INHALATION_SOLUTION | Freq: Two times a day (BID) | RESPIRATORY_TRACT | 11 refills | Status: DC

## 2021-09-04 NOTE — Addendum Note (Signed)
Addended by: Rosana Berger on: 09/04/2021 09:55 AM   Modules accepted: Orders

## 2021-09-04 NOTE — Telephone Encounter (Signed)
I called and spoke with Ysidro Evert, pharmacist and gave verbal order for albuterol  Msg sent to pt via mychart since she had sent one to [place] asking about this  Nothing further needed

## 2021-09-24 LAB — RESPIRATORY CULTURE OR RESPIRATORY AND SPUTUM CULTURE
MICRO NUMBER:: 12535282
RESULT:: NORMAL
SPECIMEN QUALITY:: ADEQUATE

## 2021-09-24 LAB — MYCOBACTERIA,CULT W/FLUOROCHROME SMEAR
MICRO NUMBER:: 12535281
SMEAR:: NONE SEEN
SPECIMEN QUALITY:: ADEQUATE

## 2021-09-28 LAB — AFB CULTURE WITH SMEAR (NOT AT ARMC)
Acid Fast Culture: NEGATIVE
Acid Fast Smear: NEGATIVE

## 2021-09-28 NOTE — Progress Notes (Signed)
Office Visit Note  Patient: April Howell             Date of Birth: 1945/11/21           MRN: [id number]             PCP: Janith Lima, MD Referring: Freddi Starr, MD Visit Date: 10/10/2021 Occupation: @GUAROCC @  Subjective:  Dry mouth and dry eyes.   History of Present Illness: April Howell is a 75 y.o. female seen in consultation per request of Dr. Erin Fulling.  According the patient her symptoms a started with dry mouth and dry eyes about 3 years ago.  She continued to have symptoms but did not see any physician for those symptoms.  She moved to [facility] in May 2022.  She states after moving here she developed a cough.  [facility] went [facility] and was given prednisone for bronchitis which did not help her symptoms.  She states she had stressful time due to movement and her husband's illness.  In October 2022 she saw Dr. Erin Fulling.  At that time she had chest x-ray followed by a CT scan of her chest.  Her CT scan was consistent with bronchiectasis.  She was also having a productive cough at the time.  She was given antibiotics and had some lab work done.  She states the cough improved and the cultures have been negative so far.  Her labs showed abnormal blood work and for that reason she was referred to me.  She was also referred to the hematologist for the evaluation of anemia.  She saw Dr. Lorenso Courier recently.  She denies any history of oral ulcers, nasal ulcers, malar rash or inflammatory arthritis.  She gives history of Raynaud's phenomenon since she was in her 80s.  She also gives history of hair loss and photosensitivity.  There is no history of lymphadenopathy.  There is no history of psoriasis.  She is gravida 2, para 2, there is no history of DVTs.  There is history of MS in her mother.  Activities of Daily Living:  Patient reports morning stiffness for 0 minutes.   Patient Denies nocturnal pain.  Difficulty dressing/grooming: Denies Difficulty climbing stairs: Denies Difficulty  getting out of chair: Denies Difficulty using hands for taps, buttons, cutlery, and/or writing: Denies  Review of Systems  Constitutional:  Positive for fatigue.  HENT:  Positive for mouth dryness and nose dryness. Negative for mouth sores.   Eyes:  Positive for dryness. Negative for pain and itching.  Respiratory:  Negative for shortness of breath and difficulty breathing.   Cardiovascular:  Negative for chest pain and palpitations.  Gastrointestinal:  Negative for blood in stool, constipation and diarrhea.  Endocrine: Negative for increased urination.  Genitourinary:  Negative for difficulty urinating.  Musculoskeletal:  Positive for myalgias and myalgias. Negative for joint pain, joint pain, joint swelling, morning stiffness and muscle tenderness.  Skin:  Positive for color change, hair loss and sensitivity to sunlight. Negative for rash and redness.  Allergic/Immunologic: Negative for susceptible to infections.  Neurological:  Negative for dizziness, numbness, headaches, memory loss and weakness.  Hematological:  Positive for bruising/bleeding tendency. Negative for swollen glands.  Psychiatric/Behavioral:  Negative for depressed mood, confusion and sleep disturbance. The patient is not nervous/anxious.    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[place]) 07/18/2021   Deficiency anemia 06/19/2021   Pulse visible in abdominal aorta 06/15/2021   Nodule  of upper lobe of right lung 06/15/2021   Purpura ([place]) 06/14/2021   Chronic fatigue 06/14/2021   Primary hypertension 06/14/2021   Cough productive of purulent sputum 06/14/2021   Abdominal bruit 06/14/2021   Elevated total protein 06/14/2021   LRTI (lower respiratory tract infection) 06/14/2021    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     Family History  Problem Relation  Age of Onset   Multiple sclerosis Mother    Heart attack Father    Heart disease Father    Hodgkin's lymphoma Sister    Breast cancer Sister    Basal cell carcinoma Sister    Healthy Son    Past Surgical History:  Procedure Laterality Date   MASTECTOMY Right    Social History   Social History Narrative   Not on file   Immunization History  Administered Date(s) Administered   PFIZER(Purple Top)SARS-COV-2 Vaccination 11/12/2019, 12/03/2019, 07/19/2020, 02/14/2021     Objective: Vital Signs: BP (!) 147/73 (BP Location: Left Arm, Patient Position: Sitting, Cuff Size: Normal)   Pulse 75   Ht 5' 9.5" (1.765 m)   Wt 125 lb 3.2 oz (56.8 kg)   BMI 18.22 kg/m    Physical Exam Vitals and nursing note reviewed.  Constitutional:      Appearance: She is well-developed.  HENT:     Head: Normocephalic and atraumatic.  Eyes:     Conjunctiva/sclera: Conjunctivae normal.  Cardiovascular:     Rate and Rhythm: Normal rate and regular rhythm.     Heart sounds: Normal heart sounds.  Pulmonary:     Effort: Pulmonary effort is normal.     Breath sounds: Normal breath sounds.  Abdominal:     General: Bowel sounds are normal.     Palpations: Abdomen is soft.  Musculoskeletal:     Cervical back: Normal range of motion.  Lymphadenopathy:     Cervical: No cervical adenopathy.  Skin:    General: Skin is warm and dry.     Capillary Refill: Capillary refill takes less than 2 seconds.  Neurological:     Mental Status: She is alert and oriented to person, place, and time.  Psychiatric:        Behavior: Behavior normal.     Musculoskeletal Exam: C-spine thoracic and lumbar spine with good range of motion.  Shoulder joints, elbow joints, wrist joints, MCPs PIPs and DIPs with good range of motion with no synovitis.  Hip joints, knee joints, ankles, MTPs and PIPs with good range of motion with no synovitis.  CDAI Exam: CDAI Score: -- Patient Global: --; Provider Global: -- Swollen: --;  Tender: -- Joint Exam 10/10/2021   No joint exam has been documented for this visit   There is currently no information documented on the homunculus. Go to the Rheumatology activity and complete the homunculus joint exam.  Investigation: No additional findings.  Imaging: No results found.  Recent Labs: Lab Results  Component Value Date   WBC 3.4 (L) 10/08/2021   HGB 10.3 (L) 10/08/2021   PLT 232 10/08/2021   NA 128 (L) 10/08/2021   K 3.9 10/08/2021   CL 98 10/08/2021   CO2 25 10/08/2021   GLUCOSE 97 10/08/2021   BUN 15 10/08/2021   CREATININE 0.83 10/08/2021   BILITOT 0.5 10/08/2021   ALKPHOS 52 10/08/2021   AST 36 10/08/2021  ALT 23 10/08/2021   PROT 10.0 (H) 10/08/2021   ALBUMIN 3.2 (L) 10/08/2021   CALCIUM 8.7 (L) 10/08/2021    Speciality Comments: No specialty comments available.  Procedures:  No procedures performed Allergies: Patient has no known allergies.   Assessment / Plan:     Visit Diagnoses: Sjogren's syndrome with other organ involvement ([place]) - Positive ANA, positive Ro, positive La, positive rheumatoid factor, sicca symptoms -she gives history of dry mouth and dry eyes for the last 3 years.  She also gives history of dry skin and vaginal dryness.  She has been using over-the-counter products.  Different treatment options were discussed.  For the symptomatic relief I discussed the use of pilocarpine.  Indications side effects were discussed at length.  Patient was in agreement to proceed with the medication.  A prescription was called in for pilocarpine 5 mg 1 tablet p.o. 3 times daily as needed.  Total 90 tablets with 2 refills were called in.  Plan: Urinalysis, Routine w reflex microscopic, Sedimentation rate  Positive ANA (antinuclear antibody) - 08/09/21: ANA 1:1280NS, Sm<1, RF 385, Ro+, La+, ANCA screen negative  -she has positive ANA.  She also gives history of Raynauds and photosensitivity.  She gives history of hair loss which she relates to  chemotherapy in the past.  I will obtain additional labs today.  Plan: Anti-scleroderma antibody, RNP Antibody, Anti-DNA antibody, double-stranded, C3 and C4, Beta-2 glycoprotein antibodies, Cardiolipin antibodies, IgG, IgM, IgA, Lupus Anticoagulant Eval w/Reflex  Rheumatoid factor positive -she had no synovitis on my examination.  I believe rheumatoid factor is related to Sjogren's syndrome.  Plan: Cyclic citrul peptide antibody, IgG  High risk medication use -in anticipation to start her on future therapy I will obtain following labs.  Plan: Hepatitis B core antibody, IgM, Hepatitis B surface antigen, Hepatitis C antibody, Thiopurine methyltransferase(tpmt)rbc, Glucose 6 phosphate dehydrogenase, IgG, IgA, IgM, QuantiFERON-TB Gold Plus  Chronic fatigue -she experiences chronic fatigue.  She had no muscle weakness or tenderness on examination.  Plan: CK  Primary hypertension-blood pressure was elevated today.  Which she relates to anxiety.  Bronchiectasis without complication (HCC)-followed by Dr. Erin Fulling.  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  Other iron deficiency anemia-she has been evaluated by Dr. Lorenso Courier.  Abnormal SPEP-evaluated by Dr. Lorenso Courier.  Osteopenia of multiple sites-calcium rich diet and exercise was emphasized.  History of breast cancer - Status post right mastectomy 2008, she had chemotherapy, no radiation therapy required.  Family history of multiple sclerosis - Mother  Orders: Orders Placed This Encounter  Procedures   Urinalysis, Routine w reflex microscopic   CK   Sedimentation rate   Cyclic citrul peptide antibody, IgG   Anti-scleroderma antibody   RNP Antibody   Anti-DNA antibody, double-stranded   C3 and C4   Beta-2 glycoprotein antibodies   Cardiolipin antibodies, IgG, IgM, IgA   Lupus Anticoagulant Eval w/Reflex   Hepatitis B core antibody, IgM   Hepatitis B  surface antigen   Hepatitis C antibody   Thiopurine methyltransferase(tpmt)rbc   Glucose 6 phosphate dehydrogenase   IgG, IgA, IgM   QuantiFERON-TB Gold Plus   Meds ordered this encounter  Medications   pilocarpine (SALAGEN) 5 MG tablet    Sig: Take 1 tablet (5 mg total) by mouth 3 (three) times daily as needed.    Dispense:  90 tablet    Refill:  2     Follow-Up Instructions:  Return for +ANA.   Bo Merino, MD  Note - This record has been created using Editor, commissioning.  Chart creation errors have been sought, but may not always  have been located. Such creation errors do not reflect on  the standard of medical care.

## 2021-10-05 ENCOUNTER — Other Ambulatory Visit: Payer: Self-pay | Admitting: *Deleted

## 2021-10-05 DIAGNOSIS — D500 Iron deficiency anemia secondary to blood loss (chronic): Secondary | ICD-10-CM

## 2021-10-08 ENCOUNTER — Other Ambulatory Visit: Payer: Self-pay

## 2021-10-08 ENCOUNTER — Inpatient Hospital Stay: Payer: Medicare Other | Attending: Hematology and Oncology | Admitting: Hematology and Oncology

## 2021-10-08 ENCOUNTER — Inpatient Hospital Stay: Payer: Medicare Other

## 2021-10-08 VITALS — BP 142/85 | HR 73 | Temp 97.7°F | Resp 17 | Ht 69.25 in | Wt 126.6 lb

## 2021-10-08 DIAGNOSIS — Z87891 Personal history of nicotine dependence: Secondary | ICD-10-CM | POA: Insufficient documentation

## 2021-10-08 DIAGNOSIS — Z8269 Family history of other diseases of the musculoskeletal system and connective tissue: Secondary | ICD-10-CM | POA: Insufficient documentation

## 2021-10-08 DIAGNOSIS — R779 Abnormality of plasma protein, unspecified: Secondary | ICD-10-CM | POA: Insufficient documentation

## 2021-10-08 DIAGNOSIS — R5383 Other fatigue: Secondary | ICD-10-CM | POA: Diagnosis not present

## 2021-10-08 DIAGNOSIS — Z8249 Family history of ischemic heart disease and other diseases of the circulatory system: Secondary | ICD-10-CM | POA: Diagnosis not present

## 2021-10-08 DIAGNOSIS — Z79899 Other long term (current) drug therapy: Secondary | ICD-10-CM | POA: Insufficient documentation

## 2021-10-08 DIAGNOSIS — R059 Cough, unspecified: Secondary | ICD-10-CM | POA: Diagnosis not present

## 2021-10-08 DIAGNOSIS — R778 Other specified abnormalities of plasma proteins: Secondary | ICD-10-CM | POA: Diagnosis not present

## 2021-10-08 DIAGNOSIS — D649 Anemia, unspecified: Secondary | ICD-10-CM | POA: Insufficient documentation

## 2021-10-08 DIAGNOSIS — Z853 Personal history of malignant neoplasm of breast: Secondary | ICD-10-CM | POA: Insufficient documentation

## 2021-10-08 DIAGNOSIS — D500 Iron deficiency anemia secondary to blood loss (chronic): Secondary | ICD-10-CM | POA: Diagnosis not present

## 2021-10-08 DIAGNOSIS — Z803 Family history of malignant neoplasm of breast: Secondary | ICD-10-CM | POA: Insufficient documentation

## 2021-10-08 DIAGNOSIS — Z808 Family history of malignant neoplasm of other organs or systems: Secondary | ICD-10-CM | POA: Diagnosis not present

## 2021-10-08 DIAGNOSIS — Z85828 Personal history of other malignant neoplasm of skin: Secondary | ICD-10-CM | POA: Diagnosis not present

## 2021-10-08 DIAGNOSIS — R911 Solitary pulmonary nodule: Secondary | ICD-10-CM | POA: Diagnosis present

## 2021-10-08 LAB — IRON AND TIBC
Iron: 104 ug/dL (ref 41–142)
Saturation Ratios: 30 % (ref 21–57)
TIBC: 353 ug/dL (ref 236–444)
UIBC: 248 ug/dL (ref 120–384)

## 2021-10-08 LAB — CBC WITH DIFFERENTIAL (CANCER CENTER ONLY)
Abs Immature Granulocytes: 0.01 10*3/uL (ref 0.00–0.07)
Basophils Absolute: 0 10*3/uL (ref 0.0–0.1)
Basophils Relative: 1 %
Eosinophils Absolute: 0.1 10*3/uL (ref 0.0–0.5)
Eosinophils Relative: 2 %
HCT: 31.1 % — ABNORMAL LOW (ref 36.0–46.0)
Hemoglobin: 10.3 g/dL — ABNORMAL LOW (ref 12.0–15.0)
Immature Granulocytes: 0 %
Lymphocytes Relative: 26 %
Lymphs Abs: 0.9 10*3/uL (ref 0.7–4.0)
MCH: 31.5 pg (ref 26.0–34.0)
MCHC: 33.1 g/dL (ref 30.0–36.0)
MCV: 95.1 fL (ref 80.0–100.0)
Monocytes Absolute: 0.3 10*3/uL (ref 0.1–1.0)
Monocytes Relative: 7 %
Neutro Abs: 2.2 10*3/uL (ref 1.7–7.7)
Neutrophils Relative %: 64 %
Platelet Count: 232 10*3/uL (ref 150–400)
RBC: 3.27 MIL/uL — ABNORMAL LOW (ref 3.87–5.11)
RDW: 13.5 % (ref 11.5–15.5)
WBC Count: 3.4 10*3/uL — ABNORMAL LOW (ref 4.0–10.5)
nRBC: 0 % (ref 0.0–0.2)

## 2021-10-08 LAB — CMP (CANCER CENTER ONLY)
ALT: 23 U/L (ref 0–44)
AST: 36 U/L (ref 15–41)
Albumin: 3.2 g/dL — ABNORMAL LOW (ref 3.5–5.0)
Alkaline Phosphatase: 52 U/L (ref 38–126)
Anion gap: 5 (ref 5–15)
BUN: 15 mg/dL (ref 8–23)
CO2: 25 mmol/L (ref 22–32)
Calcium: 8.7 mg/dL — ABNORMAL LOW (ref 8.9–10.3)
Chloride: 98 mmol/L (ref 98–111)
Creatinine: 0.83 mg/dL (ref 0.44–1.00)
GFR, Estimated: >60 mL/min (ref >60)
Glucose, Bld: 97 mg/dL (ref 70–99)
Potassium: 3.9 mmol/L (ref 3.5–5.1)
Sodium: 128 mmol/L — ABNORMAL LOW (ref 135–145)
Total Bilirubin: 0.5 mg/dL (ref 0.3–1.2)
Total Protein: 10 g/dL — ABNORMAL HIGH (ref 6.5–8.1)

## 2021-10-08 LAB — RETIC PANEL
Immature Retic Fract: 8.4 % (ref 2.3–15.9)
RBC.: 3.22 MIL/uL — ABNORMAL LOW (ref 3.87–5.11)
Retic Count, Absolute: 37.4 10*3/uL (ref 19.0–186.0)
Retic Ct Pct: 1.2 % (ref 0.4–3.1)
Reticulocyte Hemoglobin: 35.6 pg (ref >27.9)

## 2021-10-08 LAB — FERRITIN: Ferritin: 65 ng/mL (ref 11–307)

## 2021-10-08 NOTE — Progress Notes (Signed)
[place] [phone]   [phone]  PROGRESS NOTE  Patient Care Team: Janith Lima, MD as PCP - General (Internal Medicine)  Hematological/Oncological History # Lung Nodules  06/14/2021: CXR showed irregular nodular density is noted in right upper lobe during workup for productive cough x 3 months.  06/22/2021: establish care with Dr. Lorenso Courier  07/17/2021: CT scan shows peribronchovascular nodularity and mucoid impaction, likely explaining the concerning 'lung mass'  Interval History:  April Howell 75 y.o. female with medical history significant for lung nodules and iron deficiency anemia who presents for a follow up visit. The patient's last visit was on 06/22/2021 at which time she established care. In the interim since the last visit she underwent a CT scan of the lung which showed concern for atypical infection.  The patient subsequently established care with pulmonology due to this infection.  On exam today April Howell reports that she is been doing well in the interim since her last visit.  She notes that she completed antibiotic therapies and has noticed a modest improvement in her lung function.  She notes that she is coughing but not as much.  She continues to have fatigue but denies any bleeding, bruising, or dark stools.  She is tolerating iron pills well without any stomach upset such as constipation or diarrhea.  She notes that she has not noticed much difference with the p.o. iron therapy.  She does have upcoming of visits with rheumatology due to concern for Sjogren's syndrome.  Otherwise denies any fevers, chills, sweats, nausea, vomiting or diarrhea.  A full 10 point ROS is listed below.  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     SURGICAL HISTORY: Past Surgical History:  Procedure Laterality Date   MASTECTOMY Right      SOCIAL HISTORY: Social History   Socioeconomic History   Marital status: Married    Spouse name: Not on file   Number of children: Not on file   Years of education: Not on file   Highest education level: Not on file  Occupational History   Not on file  Tobacco Use   Smoking status: Former    Types: Cigarettes    Quit date: 2    Years since quitting: 51.0   Smokeless tobacco: Never  Vaping Use   Vaping Use: Not on file  Substance and Sexual Activity   Alcohol use: Not Currently    Alcohol/week: 7.0 standard drinks    Types: 7 Glasses of wine per week   Drug use: Never   Sexual activity: Yes    Partners: Male  Other Topics Concern   Not on file  Social History Narrative   Not on file   Social Determinants of Health   Financial Resource Strain: Not on file  Food Insecurity: Not on file  Transportation Needs: Not on file  Physical Activity: Not on file  Stress: Not on file  Social Connections: Not on file  Intimate Partner Violence: Not on file    FAMILY HISTORY: Family History  Problem Relation Age of Onset   Multiple sclerosis Mother    Heart attack Father    Heart disease Father    Hodgkin's lymphoma Sister    Breast cancer Sister    Basal cell carcinoma Sister     ALLERGIES:  has No Known  Allergies.  MEDICATIONS:  Current Outpatient Medications  Medication Sig Dispense Refill   albuterol (PROVENTIL) (2.5 MG/3ML) 0.083% nebulizer solution Take 3 mLs (2.5 mg total) by nebulization in the morning and at bedtime. 180 mL 11   CALCIUM PO Take 650 mg by mouth daily.     Ferrous Sulfate Dried (SLOW RELEASE IRON) 45 MG TBCR Take by mouth.     Multiple Vitamin (MULTIVITAMIN) tablet Take 1 tablet by mouth daily.     Omega-3 Fatty Acids (FISH OIL) 1000 MG CAPS Take by mouth.     No current facility-administered medications for this visit.    REVIEW OF SYSTEMS:   Constitutional: ( - ) fevers, ( - )  chills , ( - ) night sweats Eyes: ( - ) blurriness of  vision, ( - ) double vision, ( - ) watery eyes Ears, nose, mouth, throat, and face: ( - ) mucositis, ( - ) sore throat Respiratory: ( - ) cough, ( - ) dyspnea, ( - ) wheezes Cardiovascular: ( - ) palpitation, ( - ) chest discomfort, ( - ) lower extremity swelling Gastrointestinal:  ( - ) nausea, ( - ) heartburn, ( - ) change in bowel habits Skin: ( - ) abnormal skin rashes Lymphatics: ( - ) new lymphadenopathy, ( - ) easy bruising Neurological: ( - ) numbness, ( - ) tingling, ( - ) new weaknesses Behavioral/Psych: ( - ) mood change, ( - ) new changes  All other systems were reviewed with the patient and are negative.  PHYSICAL EXAMINATION:  Vitals:   10/08/21 1455  BP: (!) 142/85  Pulse: 73  Resp: 17  Temp: 97.7 F (36.5 C)  SpO2: 99%   Filed Weights   10/08/21 1455  Weight: 126 lb 9.6 oz (57.4 kg)    GENERAL: Well-appearing elderly Caucasian female, alert, no distress and comfortable SKIN: skin color, texture, turgor are normal, no rashes or significant lesions EYES: conjunctiva are pink and non-injected, sclera clear LUNGS: clear to auscultation and percussion with normal breathing effort HEART: regular rate & rhythm and no murmurs and no lower extremity edema Musculoskeletal: no cyanosis of digits and no clubbing  PSYCH: alert & oriented x 3, fluent speech NEURO: no focal motor/sensory deficits  LABORATORY DATA:  I have reviewed the data as listed CBC Latest Ref Rng & Units 10/08/2021 06/22/2021 06/20/2021  WBC 4.0 - 10.5 K/uL 3.4(L) 5.3 4.0  Hemoglobin 12.0 - 15.0 g/dL 10.3(L) 10.8(L) 10.9(L)  Hematocrit 36.0 - 46.0 % 31.1(L) 32.4(L) 32.7(L)  Platelets 150 - 400 K/uL 232 253 291.0    CMP Latest Ref Rng & Units 10/08/2021 06/22/2021 06/20/2021  Glucose 70 - 99 mg/dL 97 98 -  BUN 8 - 23 mg/dL 15 16 -  Creatinine 0.44 - 1.00 mg/dL 0.83 0.77 -  Sodium 135 - 145 mmol/L 128(L) 131(L) -  Potassium 3.5 - 5.1 mmol/L 3.9 4.2 -  Chloride 98 - 111 mmol/L 98 97(L) -  CO2 22 - 32  mmol/L 25 26 -  Calcium 8.9 - 10.3 mg/dL 8.7(L) 9.4 -  Total Protein 6.5 - 8.1 g/dL 10.0(H) 10.3(H) 9.3(H)  Total Bilirubin 0.3 - 1.2 mg/dL 0.5 0.4 -  Alkaline Phos 38 - 126 U/L 52 62 -  AST 15 - 41 U/L 36 36 -  ALT 0 - 44 U/L 23 21 -    Lab Results  Component Value Date   MPROTEIN Not Observed 06/22/2021   Lab Results  Component Value Date   KPAFRELGTCHN 116.0 (  H) 06/22/2021   LAMBDASER 68.8 (H) 06/22/2021   KAPLAMBRATIO 1.69 (H) 06/22/2021    RADIOGRAPHIC STUDIES: No results found.  ASSESSMENT & PLAN April Howell 75 y.o. female with medical history significant for lung nodules and iron deficiency anemia who presents for a follow up visit.  #Normocytic Anemia #Iron deficiency anemia -- Labs on 06/22/2021 show a hemoglobin of 10.8, MCV of 93.1, platelets of 253, white blood cell count 5.3 --labs today show hemoglobin 10.3, MCV 95.1, and platelets of 232.  May be component of anemia of chronic disease/inflammation driving down the hemoglobin. --Ferritin level 19, saturation 26%, serum iron 93 on 06/22/2021.  Iron levels pending from today. --Patient started on ferrous sulfate 325 mg p.o. daily. -- Return to clinic in 6 months time to reevaluate.  #Lung Nodule --findings initially on Xray, CT scan on 07/17/2021 does not show a mass but concern for atypical infection --Patient has established care with pulmonology for evaluation and management of this possible atypical infection. --continue to monitor   #Elevated Serum Protein -- no evidence of a monoclonal protein on initial labs and our workup. --continue to monitor  No orders of the defined types were placed in this encounter.  All questions were answered. The patient knows to call the clinic with any problems, questions or concerns.  A total of more than 30 minutes were spent on this encounter with face-to-face time and non-face-to-face time, including preparing to see the patient, ordering tests and/or medications,  counseling the patient and coordination of care as outlined above.   Ledell Peoples, MD Department of Hematology/Oncology [place] [facility] Phone: [phone] Pager: [phone] Email: [email or web address]  10/08/2021 3:41 PM

## 2021-10-09 LAB — KAPPA/LAMBDA LIGHT CHAINS
Kappa free light chain: 113.8 mg/L — ABNORMAL HIGH (ref 3.3–19.4)
Kappa, lambda light chain ratio: 1.8 — ABNORMAL HIGH (ref 0.26–1.65)
Lambda free light chains: 63.1 mg/L — ABNORMAL HIGH (ref 5.7–26.3)

## 2021-10-10 ENCOUNTER — Encounter: Payer: Self-pay | Admitting: Rheumatology

## 2021-10-10 ENCOUNTER — Other Ambulatory Visit: Payer: Self-pay

## 2021-10-10 ENCOUNTER — Ambulatory Visit (INDEPENDENT_AMBULATORY_CARE_PROVIDER_SITE_OTHER): Payer: Medicare Other | Admitting: Rheumatology

## 2021-10-10 VITALS — BP 147/73 | HR 75 | Ht 69.5 in | Wt 125.2 lb

## 2021-10-10 DIAGNOSIS — A319 Mycobacterial infection, unspecified: Secondary | ICD-10-CM

## 2021-10-10 DIAGNOSIS — R778 Other specified abnormalities of plasma proteins: Secondary | ICD-10-CM

## 2021-10-10 DIAGNOSIS — R7689 Other specified abnormal immunological findings in serum: Secondary | ICD-10-CM

## 2021-10-10 DIAGNOSIS — M3509 Sicca syndrome with other organ involvement: Secondary | ICD-10-CM | POA: Diagnosis not present

## 2021-10-10 DIAGNOSIS — Z79899 Other long term (current) drug therapy: Secondary | ICD-10-CM | POA: Diagnosis not present

## 2021-10-10 DIAGNOSIS — R058 Other specified cough: Secondary | ICD-10-CM

## 2021-10-10 DIAGNOSIS — Z853 Personal history of malignant neoplasm of breast: Secondary | ICD-10-CM

## 2021-10-10 DIAGNOSIS — D692 Other nonthrombocytopenic purpura: Secondary | ICD-10-CM

## 2021-10-10 DIAGNOSIS — Z820 Family history of epilepsy and other diseases of the nervous system: Secondary | ICD-10-CM

## 2021-10-10 DIAGNOSIS — R5382 Chronic fatigue, unspecified: Secondary | ICD-10-CM

## 2021-10-10 DIAGNOSIS — J479 Bronchiectasis, uncomplicated: Secondary | ICD-10-CM

## 2021-10-10 DIAGNOSIS — R768 Other specified abnormal immunological findings in serum: Secondary | ICD-10-CM | POA: Diagnosis not present

## 2021-10-10 DIAGNOSIS — D539 Nutritional anemia, unspecified: Secondary | ICD-10-CM

## 2021-10-10 DIAGNOSIS — M8589 Other specified disorders of bone density and structure, multiple sites: Secondary | ICD-10-CM

## 2021-10-10 DIAGNOSIS — I10 Essential (primary) hypertension: Secondary | ICD-10-CM

## 2021-10-10 DIAGNOSIS — R0989 Other specified symptoms and signs involving the circulatory and respiratory systems: Secondary | ICD-10-CM

## 2021-10-10 DIAGNOSIS — D508 Other iron deficiency anemias: Secondary | ICD-10-CM

## 2021-10-10 DIAGNOSIS — R911 Solitary pulmonary nodule: Secondary | ICD-10-CM

## 2021-10-10 MED ORDER — PILOCARPINE HCL 5 MG PO TABS: 5.0000 mg | ORAL_TABLET | Freq: Three times a day (TID) | ORAL | 2 refills | Status: DC | PRN

## 2021-10-10 NOTE — Patient Instructions (Signed)
Sjogren's Syndrome Sjgren's syndrome is an inflammatory disease in which the body's disease-fighting system (immune system) attacks the glands that produce tears (lacrimal glands) and the glands that produce saliva (salivary glands). This makes the eyes and mouth very dry. Sjgren's syndrome can also affect other parts of the body, causing dryness of the skin, nose, throat, and vagina. Sjgren's syndrome is a long-term (chronic) disorder that has no cure. In some cases, it is linked to other disorders (rheumatic disorders), such as rheumatoid arthritis and systemic lupus erythematosus (SLE). It may affect other parts of the body, such as the: Blood vessels. Joints. Lungs. Kidneys. Liver or pancreas. Brain, nerves, or spinal cord. What are the causes? The cause of this condition is not known. It may be passed along from parent to child (inherited), or it may be a symptom of a rheumatic disorder. What increases the risk? This condition is more likely to develop in: Women. People who are 18-85 years old and older. People who have recently had a viral infection or currently have a viral infection. What are the signs or symptoms? The main symptoms of this condition are: Dry mouth. This may include: A chalky feeling. Difficulty swallowing, speaking, or tasting. Frequent cavities in the teeth. Frequent mouth infections. Dry eyes. This may include: Burning, redness, and itching. Blurry vision. Fluctuating vision. Light sensitivity. Other symptoms may include: Dryness of the skin and the inside of the nose. Eyelid infections. Vaginal dryness (if applicable). Joint pain and stiffness. Muscle pain and stiffness. How is this diagnosed? This condition is diagnosed based on: Your symptoms. Your medical history. A physical exam of your eyes and mouth. Tests, including: A Schirmer test. This tests your tear production. An eye exam that is done with a magnifying device (slit-lamp exam). An  eye test that temporarily stains your eye with special dyes. This shows the extent of eye damage. Tests to check your salivary gland function. Biopsy. This is a removal of part of a salivary gland from inside your lower lip to be studied under a microscope. Chest X-rays. Blood or urine tests. How is this treated? There is no cure for this condition, but treatment can help you manage your symptoms. You may be asked to see a rheumatologist for further evaluation and treatment. This condition may be treated with: Medicines to help relieve pain and stiffness. Medicines to help relieve inflammation in your body (corticosteroids). These are usually for severe cases. Medicines to help reduce the activity of your immune system (immunosuppressants). These are usually prescribed by your health care provider or a rheumatologist. Moisture replacement therapies to help relieve dryness in your skin, mouth, and eyes. Dry eyes may be treated with: Eye drops or nasal sprays to improve dryness of the eyes. Surgery or insertion of plugs to close the lacrimal glands (punctal occlusion). This helps keep more natural tears in your eyes. Soft contact lenses or hard scleral lenses. These are occasionally used to protect the surface of the eye. Biologic lubricating eye drops (serum tears). These are eye drops made from a person's own blood. They are used in some people with severe dry eye. Follow these instructions at home: Eye care  Use eye drops and other medicines as told by your health care provider. Protect your eyes from the sun and wind with sunglasses or glasses. Blink at least 5-6 times a minute. Maintain properly humidified air. You may want to use a humidifier at home and at work. Avoid smoke. Mouth care Brush your teeth and floss  after every meal. Chew sugar-free gum or suck on hard candy. This may help to relieve dry mouth. Use antimicrobial mouthwash daily. Take frequent sips of water or sugar-free  drinks. Use saliva substitutes or lip balm as told by your health care provider. See your dentist every 6 months. General instructions  Take over-the-counter and prescription medicines only as told by your health care provider. Drink enough fluid to keep your urine pale yellow. Keep all follow-up visits. This is important. Contact a health care provider if: You have a fever. You have night sweats. You are always tired. You have unexplained weight loss. You develop itchy skin. You have red patches on your skin. You have a lump or swelling on your neck. Get help right away if: You develop severe eye pain. You develop sudden decreased vision. Summary Sjgren's syndrome is a disease in which the body's immune system attacks the glands that produce tears and the glands that produce saliva. This condition makes the eyes and mouth very dry. Sjgren's syndrome is a long-term (chronic) disorder. There is no cure for this condition, but treatment can help you manage your symptoms. The cause of this condition is not known. You may be asked to see a rheumatologist for further evaluation and treatment. This information is not intended to replace advice given to you by your health care provider. Make sure you discuss any questions you have with your health care provider. Document Revised: 05/07/2021 Document Reviewed: 05/07/2021 Elsevier Patient Education  [place].     Pilocarpine tablets What is this medication? PILOCARPINE (PYE loe KAR peen) can increase saliva in the mouth. This medicine is used to treat dry mouth from radiation treatment or from Sjogren's syndrome. This medicine may be used for other purposes; ask your health care provider or pharmacist if you have questions. COMMON BRAND NAME(S): Salagen What should I tell my care team before I take this medication? They need to know if you have any of these conditions: eye infection or other eye problems glaucoma heart  disease liver disease lung or breathing disease, like asthma an unusual or allergic reaction to pilocarpine, other medicines, foods, dyes, or preservatives pregnant or trying to get pregnant breast-feeding How should I use this medication? Take this medicine by mouth with a glass of water. Follow the directions on the prescription label. Take your doses at regular intervals. Do not take your medicine more often than directed. Talk to your pediatrician regarding the use of this medicine in children. Special care may be needed. Overdosage: If you think you have taken too much of this medicine contact a poison control center or emergency room at once. NOTE: This medicine is only for you. Do not share this medicine with others. What if I miss a dose? If you miss a dose, take it as soon as you can. If it is almost time for your next dose, take only that dose. Do not take double or extra doses. What may interact with this medication? antihistamines for allergy, cough and cold atropine certain medicines for Alzheimer's disease like donepezil, galantamine, rivastigmine certain medicines for bladder problems like bethanecol, oxybutynin, tolterodine certain medicines for Parkinson's disease like benztropine, trihexyphenidyl certain medicines for quitting smoking like nicotine certain medicines for stomach problems like dicyclomine, hyoscyamine certain medicines for travel sickness like scopolamine ipratropium medicines for blood pressure or heart problems like metoprolol This list may not describe all possible interactions. Give your health care provider a list of all the medicines, herbs, non-prescription drugs,  or dietary supplements you use. Also tell them if you smoke, drink alcohol, or use illegal drugs. Some items may interact with your medicine. What should I watch for while using this medication? Visit your doctor for regular check ups. Tell your doctor if your symptoms do not get better or  if they get worse. You may get blurry vision or have trouble telling how far something is from you. This may be a problem at night or when the lights are low. Do not drive, use machinery, or do anything that needs clear vision until you know how this medicine affects you. If you sweat a lot, drink enough to replace fluids. Do not get dehydrated. What side effects may I notice from receiving this medication? Side effects that you should report to your doctor or health care professional as soon as possible: allergic reactions like skin rash, itching or hives breathing problems confusion irregular heartbeat stomach pains tremor unusual blood pressure unusually weak or tired vomiting Side effects that usually do not require medical attention (report to your doctor or health care professional if they continue or are bothersome): changes in vision chills, flushing diarrhea headache increased sweating nausea runny eyes, nose urgent need to pass urine This list may not describe all possible side effects. Call your doctor for medical advice about side effects. You may report side effects to FDA at 1-800-FDA-1088. Where should I keep my medication? Keep out of the reach of children. Store at room temperature between 15 and 30 degrees C (59 and 86 degrees F). Throw away any unused medicine after the expiration date. NOTE: This sheet is a summary. It may not cover all possible information. If you have questions about this medicine, talk to your doctor, pharmacist, or health care provider.  2022 Elsevier/Gold Standard (2008-06-01 00:00:00)

## 2021-10-11 ENCOUNTER — Other Ambulatory Visit: Payer: Self-pay | Admitting: Rheumatology

## 2021-10-12 LAB — SEDIMENTATION RATE: Sed Rate: 130 mm/h — ABNORMAL HIGH (ref 0–30)

## 2021-10-12 NOTE — Progress Notes (Signed)
Sed rate is very high which can be seen in infection, inflammation or malignancy.  She did not have any inflammation on my examination.  She had recent pulmonary infection.  Please ask patient if she has any symptoms of infection she should be seen by her PCP/urgent care or emergency room.  Please forward results to her pulmonologist and oncologist.

## 2021-10-15 NOTE — Progress Notes (Signed)
I saw April Howell for the initial consultation on December 22.  She had no synovitis on my examination.  She meets the criteria for Sjogren's but she has mild symptoms from Sjogren's.  She has positive rheumatoid factor which is not unusual to see in her Sjogren's.  I cannot explain her elevated sedimentation rate based on the autoimmune disease.  Please evaluate for other causes of elevated sedimentation rate.

## 2021-10-16 LAB — MULTIPLE MYELOMA PANEL, SERUM
Albumin SerPl Elph-Mcnc: 3.4 g/dL (ref 2.9–4.4)
Albumin/Glob SerPl: 0.6 — ABNORMAL LOW (ref 0.7–1.7)
Alpha 1: 0.2 g/dL (ref 0.0–0.4)
Alpha2 Glob SerPl Elph-Mcnc: 0.7 g/dL (ref 0.4–1.0)
B-Globulin SerPl Elph-Mcnc: 1.4 g/dL — ABNORMAL HIGH (ref 0.7–1.3)
Gamma Glob SerPl Elph-Mcnc: 4.1 g/dL — ABNORMAL HIGH (ref 0.4–1.8)
Globulin, Total: 6.4 g/dL — ABNORMAL HIGH (ref 2.2–3.9)
IgA: 790 mg/dL — ABNORMAL HIGH (ref 64–422)
IgG (Immunoglobin G), Serum: 4183 mg/dL — ABNORMAL HIGH (ref 586–1602)
IgM (Immunoglobulin M), Srm: 80 mg/dL (ref 26–217)
Total Protein ELP: 9.8 g/dL — ABNORMAL HIGH (ref 6.0–8.5)

## 2021-10-18 LAB — URINALYSIS, ROUTINE W REFLEX MICROSCOPIC
Bilirubin Urine: NEGATIVE
Glucose, UA: NEGATIVE
Hgb urine dipstick: NEGATIVE
Ketones, ur: NEGATIVE
Leukocytes,Ua: NEGATIVE
Nitrite: NEGATIVE
Protein, ur: NEGATIVE
Specific Gravity, Urine: 1.009 (ref 1.001–1.035)
pH: 6.5 (ref 5.0–8.0)

## 2021-10-18 LAB — QUANTIFERON-TB GOLD PLUS
Mitogen-NIL: 0.91 IU/mL
NIL: 0.03 IU/mL
QuantiFERON-TB Gold Plus: NEGATIVE
TB1-NIL: 0.01 IU/mL
TB2-NIL: 0.01 IU/mL

## 2021-10-18 LAB — C3 AND C4
C3 Complement: 105 mg/dL (ref 83–193)
C4 Complement: 15 mg/dL (ref 15–57)

## 2021-10-18 LAB — GLUCOSE 6 PHOSPHATE DEHYDROGENASE: G-6PDH: 14.1 U/g Hgb (ref 7.0–20.5)

## 2021-10-18 LAB — HEPATITIS B SURFACE ANTIGEN: Hepatitis B Surface Ag: NONREACTIVE

## 2021-10-18 LAB — ANTI-SCLERODERMA ANTIBODY: Scleroderma (Scl-70) (ENA) Antibody, IgG: <1 AI

## 2021-10-18 LAB — THIOPURINE METHYLTRANSFERASE (TPMT), RBC: Thiopurine Methyltransferase, RBC: 14 nmol/hr/mL RBC

## 2021-10-18 LAB — RNP ANTIBODY: Ribonucleic Protein(ENA) Antibody, IgG: <1 AI

## 2021-10-18 LAB — LUPUS ANTICOAGULANT EVAL W/ REFLEX
PTT-LA Screen: 32 s (ref <=40)
dRVVT: 26 s (ref <=45)

## 2021-10-18 LAB — HEPATITIS C ANTIBODY
Hepatitis C Ab: NONREACTIVE
SIGNAL TO CUT-OFF: 0.12 (ref <1.00)

## 2021-10-18 LAB — CARDIOLIPIN ANTIBODIES, IGG, IGM, IGA
Anticardiolipin IgA: <2 APL-U/mL
Anticardiolipin IgG: <2 GPL-U/mL
Anticardiolipin IgM: <2 MPL-U/mL

## 2021-10-18 LAB — BETA-2 GLYCOPROTEIN ANTIBODIES
Beta-2 Glyco 1 IgA: <2 U/mL
Beta-2 Glyco 1 IgM: <2 U/mL
Beta-2 Glyco I IgG: <2 U/mL

## 2021-10-18 LAB — IGG, IGA, IGM
IgG (Immunoglobin G), Serum: 4339 mg/dL — ABNORMAL HIGH (ref 600–1540)
IgM, Serum: 79 mg/dL (ref 50–300)
Immunoglobulin A: 892 mg/dL — ABNORMAL HIGH (ref 70–320)

## 2021-10-18 LAB — SEDIMENTATION RATE

## 2021-10-18 LAB — CYCLIC CITRUL PEPTIDE ANTIBODY, IGG: Cyclic Citrullin Peptide Ab: <16 UNITS

## 2021-10-18 LAB — HEPATITIS B CORE ANTIBODY, IGM: Hep B C IgM: NONREACTIVE

## 2021-10-18 LAB — CK: Total CK: 22 U/L — ABNORMAL LOW (ref 29–143)

## 2021-10-18 LAB — ANTI-DNA ANTIBODY, DOUBLE-STRANDED: ds DNA Ab: <1 IU/mL

## 2021-10-19 ENCOUNTER — Telehealth: Payer: Self-pay | Admitting: *Deleted

## 2021-10-19 DIAGNOSIS — M3509 Sicca syndrome with other organ involvement: Secondary | ICD-10-CM

## 2021-10-19 DIAGNOSIS — R768 Other specified abnormal immunological findings in serum: Secondary | ICD-10-CM

## 2021-10-19 NOTE — Progress Notes (Signed)
Office Visit Note  Patient: April Howell             Date of Birth: April 02, 1946           MRN: [id number]             PCP: Janith Lima, MD Referring: Janith Lima, MD Visit Date: 10/30/2021 Occupation: @GUAROCC @  Subjective:  Dry mouth and dry eyes   History of Present Illness: April Howell is a 75 y.o. female with a history of Sjogren's syndrome and chronic bronchitis returns for the follow-up visit today.  She continues to have dry mouth and dry eye symptoms.  She states she tried pilocarpine but could not tolerate due to increased sweating and hot flashes.  She states she has some discomfort in the trapezius region.  She has no difficulty raising her arms or difficulty getting up from the chair.  She has not had any recent infection or fever.  She denies any history of oral ulcers, nasal ulcers, malar rash, lymphadenopathy.  She has mild Raynaud's phenomenon which is not currently active.  She has lost 15 pounds weight over the last 1 year.  Activities of Daily Living:  Patient reports morning stiffness for 1 hour.   Patient Denies nocturnal pain.  Difficulty dressing/grooming: Denies Difficulty climbing stairs: Denies Difficulty getting out of chair: Denies Difficulty using hands for taps, buttons, cutlery, and/or writing: Denies  Review of Systems  Constitutional:  Positive for fatigue and weight loss. Negative for night sweats and weight gain.       15 pounds weight loss over one year  HENT:  Positive for mouth dryness and nose dryness. Negative for mouth sores, trouble swallowing and trouble swallowing.   Eyes:  Positive for dryness. Negative for pain, redness, itching and visual disturbance.  Respiratory:  Negative for cough, shortness of breath and difficulty breathing.   Cardiovascular:  Negative for chest pain, palpitations, hypertension, irregular heartbeat and swelling in legs/feet.  Gastrointestinal:  Negative for blood in stool, constipation and diarrhea.   Endocrine: Negative for increased urination.  Genitourinary:  Negative for difficulty urinating and vaginal dryness.  Musculoskeletal:  Positive for joint pain, joint pain, myalgias, morning stiffness, muscle tenderness and myalgias. Negative for joint swelling and muscle weakness.  Skin:  Negative for color change, rash, hair loss, redness, skin tightness, ulcers and sensitivity to sunlight.  Allergic/Immunologic: Positive for susceptible to infections.  Neurological:  Positive for weakness. Negative for dizziness, numbness, headaches, memory loss and night sweats.  Hematological:  Positive for bruising/bleeding tendency. Negative for swollen glands.  Psychiatric/Behavioral:  Negative for depressed mood, confusion and sleep disturbance. The patient is not nervous/anxious.    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   Deficiency anemia 06/19/2021   Pulse visible in abdominal aorta 06/15/2021   Nodule of upper lobe of right lung 06/15/2021   Purpura ([place]) 06/14/2021   Chronic fatigue 06/14/2021   Primary hypertension 06/14/2021   Cough productive of purulent sputum 06/14/2021   Abdominal bruit 06/14/2021   Elevated total protein 06/14/2021   LRTI (lower respiratory tract infection) 06/14/2021    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     Family History  Problem Relation Age of Onset   Multiple sclerosis Mother    Heart attack Father  Heart disease Father    Hodgkin's lymphoma Sister    Breast cancer Sister    Basal cell carcinoma Sister    Healthy Son    Past Surgical History:  Procedure Laterality Date   MASTECTOMY Right    Social History   Social History Narrative   Not on file   Immunization History  Administered Date(s) Administered   PFIZER(Purple Top)SARS-COV-2 Vaccination 11/12/2019,  12/03/2019, 07/19/2020, 02/14/2021     Objective: Vital Signs: BP 130/85 (BP Location: Left Arm, Patient Position: Sitting, Cuff Size: Normal)    Pulse 75    Ht 5' 9.75" (1.772 m)    Wt 125 lb 3.2 oz (56.8 kg)    BMI 18.09 kg/m    Physical Exam Vitals and nursing note reviewed.  Constitutional:      Appearance: She is well-developed.  HENT:     Head: Normocephalic and atraumatic.  Eyes:     Conjunctiva/sclera: Conjunctivae normal.  Cardiovascular:     Rate and Rhythm: Normal rate and regular rhythm.     Heart sounds: Normal heart sounds.  Pulmonary:     Effort: Pulmonary effort is normal.     Breath sounds: Normal breath sounds.  Abdominal:     General: Bowel sounds are normal.     Palpations: Abdomen is soft.  Musculoskeletal:     Cervical back: Normal range of motion.  Lymphadenopathy:     Cervical: No cervical adenopathy.  Skin:    General: Skin is warm and dry.     Capillary Refill: Capillary refill takes less than 2 seconds.  Neurological:     Mental Status: She is alert and oriented to person, place, and time.  Psychiatric:        Behavior: Behavior normal.     Musculoskeletal Exam: C-spine thoracic and lumbar spine were in good range of motion with no tenderness.  Shoulder joints, elbow joints, wrist joints, MCPs PIPs and DIPs with good range of motion with no synovitis.  Hip joints, knee joints, ankles, MTPs and PIPs with good range of motion with no synovitis.  CDAI Exam: CDAI Score: -- Patient Global: --; Provider Global: -- Swollen: --; Tender: -- Joint Exam 10/30/2021   No joint exam has been documented for this visit   There is currently no information documented on the homunculus. Go to the Rheumatology activity and complete the homunculus joint exam.  Investigation: No additional findings.  Imaging: No results found.  Recent Labs: Lab Results  Component Value Date   WBC 3.4 (L) 10/08/2021   HGB 10.3 (L) 10/08/2021   PLT 232 10/08/2021   NA  128 (L) 10/08/2021   K 3.9 10/08/2021   CL 98 10/08/2021   CO2 25 10/08/2021   GLUCOSE 97 10/08/2021   BUN 15 10/08/2021   CREATININE 0.83 10/08/2021   BILITOT 0.5 10/08/2021   ALKPHOS 52 10/08/2021   AST 36 10/08/2021   ALT 23 10/08/2021   PROT 10.0 (H) 10/08/2021   ALBUMIN 3.2 (L) 10/08/2021   CALCIUM 8.7 (L) 10/08/2021   QFTBGOLDPLUS NEGATIVE 10/10/2021   October 10, 2021 UA negative, TB Gold negative, G6PD normal, TPMT normal, hepatitis B-, hepatitis C negative, CK22, ENA (SCL 70, RNP, double-stranded DNA negative), C3-C4 normal, lupus anticoagulant negative, anticardiolipin negative, beta-2 GP 1 negative, anti-CCP negative, OQH476  08/09/21: ANA 1:1280NS, Sm<1, RF 385, Ro+, La+, ANCA screen negative  Speciality Comments: No specialty comments available.  Procedures:  No procedures performed Allergies: Patient has no known allergies.   Assessment /  Plan:     Visit Diagnoses: Sjogren's syndrome with other organ involvement ([place]) - Positive ANA, positive Ro, positive La, positive RF, sicca symptoms (dry mouth, dry eyes, dry skin, vaginal dryness).Pilocarpine was prescribed at the last visit.  Patient states she tried pilocarpine but discontinued due to increased sweating and hot flashes.  I discussed decreasing the dose of pilocarpine to 2.5 mg p.o. 3 times daily as needed.  She will try the dosing.  Over-the-counter products were discussed at length.  I also discussed possible use of hydroxychloroquine.  Indications advised contraindications were discussed.  A handout was given and consent was taken.  Based on her height and weight her dose will be hydroxychloroquine 200 mg p.o. twice daily Monday to Friday only.  We discussed elevated sedimentation rate.  She does not have active autoimmune disease to explain elevated sedimentation rate.  Her complements are normal.  She will see Dr. Lorenso Courier for evaluation of elevated sedimentation rate.  She wants to hold off hydroxychloroquine until  her evaluation by Dr. Lorenso Courier.  Patient was counseled on the purpose, proper use, and adverse effects of hydroxychloroquine including nausea/diarrhea, skin rash, headaches, and sun sensitivity.  Advised patient to wear sunscreen once starting hydroxychloroquine to reduce risk of rash associated with sun sensitivity.  Discussed importance of annual eye exams while on hydroxychloroquine to monitor to ocular toxicity and discussed importance of frequent laboratory monitoring.  Provided patient with eye exam form for baseline ophthalmologic exam.  Reviewed risk for QTC prolongation when used in combination with other QTc prolonging agents (including but not limited to antiarrhythmics, macrolide antibiotics, flouroquinolones, tricyclic antidepressants, citalopram, specific antipsychotics, ondansetron, migraine triptans, and methadone). Provided patient with educational materials on hydroxychloroquine and answered all questions.  Patient consented to hydroxychloroquine. Will upload consent in the media tab.    Dose will be Plaquenil 200 mg twice daily Monday through Friday.  Prescription pending oncology evaluation.  High risk medication use -handout on hydroxychloroquine was given and consent was taken.  Information on immunization was placed in the AVS.  She was also advised to hold hydroxychloroquine in case she develops an infection and resume after the infection resolves.  We will check labs in a month after starting hydroxychloroquine and then every 3 months to monitor for drug toxicity.  Elevated sed rate-her sed rate was 130 on October 11, 2021.  She denies any history of fever.  She has history of recurrent bronchiectasis and also has abnormal SPEP.  She denies any current infection or cough.  She was recently treated by Dr. Erin Fulling with antibiotics.  I have advised her to schedule an appointment with Dr. Lorenso Courier for evaluation.  Weight loss-patient gives history of 15 pounds weight loss over the last  year.  Chronic fatigue - CK is normal.  She complains of some trapezius discomfort.  She had no muscular weakness or tenderness.  She had no difficulty getting up from chair or raising her arms.  She may have a component of myofascial pain.  Primary hypertension-blood pressure was normal today.  Bronchiectasis without complication ([place]) - Followed by Dr. Erin Fulling.  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  Abnormal SPEP - Followed by Dr. Lorenso Courier.  Other iron deficiency anemia-her last hemoglobin was 10.3 on October 08, 2021.  Osteopenia of multiple sites-I do not have the DEXA results available.  History of breast cancer - Right mastectomy 2008, status postchemotherapy only.  Family history of multiple  sclerosis - Mother.  Orders: No orders of the defined types were placed in this encounter.  No orders of the defined types were placed in this encounter.    Follow-Up Instructions: Return in about 2 months (around 12/28/2021) for Sjogren's.   Bo Merino, MD  Note - This record has been created using Editor, commissioning.  Chart creation errors have been sought, but may not always  have been located. Such creation errors do not reflect on  the standard of medical care.

## 2021-10-19 NOTE — Progress Notes (Signed)
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  Her autoimmune disease is not very active to explain high immunoglobulin levels.  Please refer to hematology for evaluation.  I will discuss treatment options at the follow-up visit.

## 2021-10-19 NOTE — Telephone Encounter (Signed)
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  Her autoimmune disease is not very active to explain high immunoglobulin levels.  Please refer to hematology for evaluation.  I will discuss treatment options at the follow-up visit.

## 2021-10-30 ENCOUNTER — Other Ambulatory Visit: Payer: Self-pay

## 2021-10-30 ENCOUNTER — Ambulatory Visit (INDEPENDENT_AMBULATORY_CARE_PROVIDER_SITE_OTHER): Payer: Medicare Other | Admitting: Rheumatology

## 2021-10-30 ENCOUNTER — Encounter: Payer: Self-pay | Admitting: Rheumatology

## 2021-10-30 ENCOUNTER — Telehealth: Payer: Self-pay

## 2021-10-30 ENCOUNTER — Encounter: Payer: Self-pay | Admitting: Hematology and Oncology

## 2021-10-30 VITALS — BP 130/85 | HR 75 | Ht 69.75 in | Wt 125.2 lb

## 2021-10-30 DIAGNOSIS — M3509 Sicca syndrome with other organ involvement: Secondary | ICD-10-CM | POA: Diagnosis not present

## 2021-10-30 DIAGNOSIS — R778 Other specified abnormalities of plasma proteins: Secondary | ICD-10-CM

## 2021-10-30 DIAGNOSIS — M8589 Other specified disorders of bone density and structure, multiple sites: Secondary | ICD-10-CM

## 2021-10-30 DIAGNOSIS — R700 Elevated erythrocyte sedimentation rate: Secondary | ICD-10-CM

## 2021-10-30 DIAGNOSIS — Z79899 Other long term (current) drug therapy: Secondary | ICD-10-CM | POA: Diagnosis not present

## 2021-10-30 DIAGNOSIS — R911 Solitary pulmonary nodule: Secondary | ICD-10-CM

## 2021-10-30 DIAGNOSIS — D508 Other iron deficiency anemias: Secondary | ICD-10-CM

## 2021-10-30 DIAGNOSIS — Z820 Family history of epilepsy and other diseases of the nervous system: Secondary | ICD-10-CM

## 2021-10-30 DIAGNOSIS — I10 Essential (primary) hypertension: Secondary | ICD-10-CM

## 2021-10-30 DIAGNOSIS — D692 Other nonthrombocytopenic purpura: Secondary | ICD-10-CM

## 2021-10-30 DIAGNOSIS — R5382 Chronic fatigue, unspecified: Secondary | ICD-10-CM | POA: Diagnosis not present

## 2021-10-30 DIAGNOSIS — J479 Bronchiectasis, uncomplicated: Secondary | ICD-10-CM

## 2021-10-30 DIAGNOSIS — Z853 Personal history of malignant neoplasm of breast: Secondary | ICD-10-CM

## 2021-10-30 DIAGNOSIS — R634 Abnormal weight loss: Secondary | ICD-10-CM

## 2021-10-30 NOTE — Patient Instructions (Signed)
Hydroxychloroquine Tablets What is this medication? HYDROXYCHLOROQUINE (hye drox ee KLOR oh kwin) treats autoimmune conditions, such as rheumatoid arthritis and lupus. It works by slowing down an overactive immune system. It may also be used to prevent and treat malaria. It works by killing the parasite that causes malaria. It belongs to a group of medications called DMARDs. This medicine may be used for other purposes; ask your health care provider or pharmacist if you have questions. COMMON BRAND NAME(S): Plaquenil, Quineprox What should I tell my care team before I take this medication? They need to know if you have any of these conditions: Diabetes Eye disease, vision problems G6PD deficiency Heart disease History of irregular heartbeat If you often drink alcohol Kidney disease Liver disease Porphyria Psoriasis An unusual or allergic reaction to chloroquine, hydroxychloroquine, other medications, foods, dyes, or preservatives Pregnant or trying to get pregnant Breast-feeding How should I use this medication? Take this medication by mouth with a glass of water. Take it as directed on the prescription label. Do not cut, crush or chew this medication. Swallow the tablets whole. Take it with food. Do not take it more than directed. Take all of this medication unless your care team tells you to stop it early. Keep taking it even if you think you are better. Take products with antacids in them at a different time of day than this medication. Take this medication 4 hours before or 4 hours after antacids. Talk to your care team if you have questions. Talk to your care team about the use of this medication in children. While this medication may be prescribed for selected conditions, precautions do apply. Overdosage: If you think you have taken too much of this medicine contact a poison control center or emergency room at once. NOTE: This medicine is only for you. Do not share this medicine with  others. What if I miss a dose? If you miss a dose, take it as soon as you can. If it is almost time for your next dose, take only that dose. Do not take double or extra doses. What may interact with this medication? Do not take this medication with any of the following: Cisapride Dronedarone Pimozide Thioridazine This medication may also interact with the following: Ampicillin Antacids Cimetidine Cyclosporine Digoxin Kaolin Medications for diabetes, like insulin, glipizide, glyburide Medications for seizures like carbamazepine, phenobarbital, phenytoin Mefloquine Methotrexate Other medications that prolong the QT interval (cause an abnormal heart rhythm) Praziquantel This list may not describe all possible interactions. Give your health care provider a list of all the medicines, herbs, non-prescription drugs, or dietary supplements you use. Also tell them if you smoke, drink alcohol, or use illegal drugs. Some items may interact with your medicine. What should I watch for while using this medication? Visit your care team for regular checks on your progress. Tell your care team if your symptoms do not start to get better or if they get worse. You may need blood work done while you are taking this medication. If you take other medications that can affect heart rhythm, you may need more testing. Talk to your care team if you have questions. Your vision may be tested before and during use of this medication. Tell your care team right away if you have any change in your eyesight. This medication may cause serious skin reactions. They can happen weeks to months after starting the medication. Contact your care team right away if you notice fevers or flu-like symptoms with a rash. The  rash may be red or purple and then turn into blisters or peeling of the skin. Or, you might notice a red rash with swelling of the face, lips or lymph nodes in your neck or under your arms. If you or your family  notice any changes in your behavior, such as new or worsening depression, thoughts of harming yourself, anxiety, or other unusual or disturbing thoughts, or memory loss, call your care team right away. What side effects may I notice from receiving this medication? Side effects that you should report to your care team as soon as possible: Allergic reactions--skin rash, itching, hives, swelling of the face, lips, tongue, or throat Aplastic anemia--unusual weakness or fatigue, dizziness, headache, trouble breathing, increased bleeding or bruising Change in vision Heart rhythm changes--fast or irregular heartbeat, dizziness, feeling faint or lightheaded, chest pain, trouble breathing Infection--fever, chills, cough, or sore throat Low blood sugar (hypoglycemia)--tremors or shaking, anxiety, sweating, cold or clammy skin, confusion, dizziness, rapid heartbeat Muscle injury--unusual weakness or fatigue, muscle pain, dark yellow or brown urine, decrease in amount of urine Pain, tingling, or numbness in the hands or feet Rash, fever, and swollen lymph nodes Redness, blistering, peeling, or loosening of the skin, including inside the mouth Thoughts of suicide or self-harm, worsening mood, or feelings of depression Unusual bruising or bleeding Side effects that usually do not require medical attention (report to your care team if they continue or are bothersome): Diarrhea Headache Nausea Stomach pain Vomiting This list may not describe all possible side effects. Call your doctor for medical advice about side effects. You may report side effects to FDA at 1-800-FDA-1088. Where should I keep my medication? Keep out of the reach of children and pets. Store at room temperature up to 30 degrees C (86 degrees F). Protect from light. Get rid of any unused medication after the expiration date. To get rid of medications that are no longer needed or have expired: Take the medication to a medication take-back  program. Check with your pharmacy or law enforcement to find a location. If you cannot return the medication, check the label or package insert to see if the medication should be thrown out in the garbage or flushed down the toilet. If you are not sure, ask your care team. If it is safe to put it in the trash, empty the medication out of the container. Mix the medication with cat litter, dirt, coffee grounds, or other unwanted substance. Seal the mixture in a bag or container. Put it in the trash. NOTE: This sheet is a summary. It may not cover all possible information. If you have questions about this medicine, talk to your doctor, pharmacist, or health care provider.  2022 Elsevier/Gold Standard (2021-02-22 00:00:00)   Standing Labs We placed an order today for your standing lab work.   Please have your standing labs drawn in 1 month after starting hydroxychloroquine and then every 3 months  If possible, please have your labs drawn 2 weeks prior to your appointment so that the provider can discuss your results at your appointment.  Please note that you may see your imaging and lab results in [facility] before we have reviewed them. We may be awaiting multiple results to interpret others before contacting you. Please allow our office up to 72 hours to thoroughly review all of the results before contacting the office for clarification of your results.  We have open lab daily: Monday through Thursday from 1:30-4:30 PM and Friday from 1:30-4:00 PM at the  office of Dr. Bo Merino, [facility].   Please be advised, all patients with office appointments requiring lab work will take precedent over walk-in lab work.  If possible, please come for your lab work on Monday and Friday afternoons, as you may experience shorter wait times. The office is located at [address] [place], [place], [place] [address] No appointment is necessary.   Labs are drawn by [facility]. Please bring your  co-pay at the time of your lab draw.  You may receive a bill from [facility] for your lab work.  If you wish to have your labs drawn at another location, please call the office 24 hours in advance to send orders.  If you have any questions regarding directions or hours of operation,  please call [phone].   As a reminder, please drink plenty of water prior to coming for your lab work. Thanks!   Vaccines You are taking a medication(s) that can suppress your immune system.  The following immunizations are recommended: Flu annually Covid-19  Td/Tdap (tetanus, diphtheria, pertussis) every 10 years Pneumonia (Prevnar 15 then Pneumovax 23 at least 1 year apart.  Alternatively, can take Prevnar 20 without needing additional dose) Shingrix: 2 doses from 4 weeks to 6 months apart  Please check with your PCP to make sure you are up to date.   If you have signs or symptoms of an infection or start antibiotics: First, call your PCP for workup of your infection. Hold your medication through the infection, until you complete your antibiotics, and until symptoms resolve if you take the following: Injectable medication (Actemra, Benlysta, Cimzia, Cosentyx, Enbrel, Humira, Kevzara, Orencia, Remicade, Simponi, Stelara, Taltz, Tremfya) Methotrexate Leflunomide (Arava) Mycophenolate (Cellcept) Morrie Sheldon, Olumiant, or Rinvoq

## 2021-10-30 NOTE — Telephone Encounter (Signed)
Patient was consented on plaquenil today. Consent form was sent to the scan center. Patient will contact the office when she is ready to start plaquenil per Dr. Bronson Curb. Thanks!

## 2021-10-31 ENCOUNTER — Telehealth: Payer: Self-pay | Admitting: Hematology and Oncology

## 2021-10-31 NOTE — Telephone Encounter (Signed)
Sch per 1/11 HP inbasket, pt aware

## 2021-11-05 ENCOUNTER — Ambulatory Visit: Payer: PRIVATE HEALTH INSURANCE | Admitting: Hematology and Oncology

## 2021-11-05 ENCOUNTER — Telehealth: Payer: Self-pay | Admitting: *Deleted

## 2021-11-05 ENCOUNTER — Other Ambulatory Visit: Payer: PRIVATE HEALTH INSURANCE

## 2021-11-05 NOTE — Telephone Encounter (Signed)
TCT patient regarding today's appt. Spoke with pt. Advised that Dr. Lorenso Courier just her in December 2022 and was curious as to why Dr. Patrecia Pour wanted her to be seen again so soon. Pt states she was told to r/o malignancy due to high sed rate. Asked pt when her last mammogram was. She states it was a year ago and she will get it scheduled for this year. Her last mammogram was clear.  She states she had a colonoscopy 7 years ago in  and it was clear as well and she did not need one for 10 years.  She will also be having a lung cancer screening CT scan this September. Advised that her labs from 09/2021 did not reveal any blood malignancies. Per Dr. Lorenso Courier, there would not be any more to add to his workup from December. Pt is in agreement and therefore today's appt will be cancelled. April Howell will return in June 2023 for her 6 month f/u for her anemia.  Pt is very ok with this plan. Dr. Lorenso Courier to call Dr. Lillia Carmel in regards to the above.

## 2021-11-22 ENCOUNTER — Encounter: Payer: Self-pay | Admitting: Rheumatology

## 2021-11-22 MED ORDER — HYDROXYCHLOROQUINE SULFATE 200 MG PO TABS: ORAL_TABLET | ORAL | 0 refills | Status: DC

## 2021-11-22 NOTE — Telephone Encounter (Signed)
Please schedule patient a follow up visit. Patient due March 2023. Thanks!

## 2021-11-22 NOTE — Telephone Encounter (Signed)
Next Visit: Due March 2023. Message sent to the front to schedule patient.   Last Visit: 10/30/2021  Labs: 10/08/2021 WBC 3.4, RBC 3.27, Hgb 10.3, Hct 31.1, Sodium 128, Calcium 8.7, Total Protein 10.0, Albumin 3.2  Eye exam: 11/20/2021 WNL   Current Dose per office note 10/30/2021: hydroxychloroquine 200 mg p.o. twice daily Monday to Friday only  DX: Sjogren's syndrome with other organ involvement   Okay to refill Plaquenil?

## 2021-11-26 ENCOUNTER — Encounter: Payer: Self-pay | Admitting: Rheumatology

## 2021-12-19 ENCOUNTER — Encounter: Payer: Self-pay | Admitting: Student

## 2021-12-19 ENCOUNTER — Other Ambulatory Visit: Payer: Self-pay

## 2021-12-19 ENCOUNTER — Ambulatory Visit (INDEPENDENT_AMBULATORY_CARE_PROVIDER_SITE_OTHER): Payer: Medicare Other | Admitting: Student

## 2021-12-19 VITALS — BP 122/65 | HR 78 | Temp 98.1°F | Ht 69.0 in | Wt 124.2 lb

## 2021-12-19 DIAGNOSIS — M818 Other osteoporosis without current pathological fracture: Secondary | ICD-10-CM

## 2021-12-19 DIAGNOSIS — R5382 Chronic fatigue, unspecified: Secondary | ICD-10-CM | POA: Diagnosis not present

## 2021-12-19 DIAGNOSIS — R911 Solitary pulmonary nodule: Secondary | ICD-10-CM | POA: Diagnosis not present

## 2021-12-19 DIAGNOSIS — M3500 Sicca syndrome, unspecified: Secondary | ICD-10-CM

## 2021-12-19 DIAGNOSIS — M810 Age-related osteoporosis without current pathological fracture: Secondary | ICD-10-CM | POA: Insufficient documentation

## 2021-12-19 DIAGNOSIS — C50919 Malignant neoplasm of unspecified site of unspecified female breast: Secondary | ICD-10-CM | POA: Insufficient documentation

## 2021-12-19 DIAGNOSIS — Z1231 Encounter for screening mammogram for malignant neoplasm of breast: Secondary | ICD-10-CM

## 2021-12-19 DIAGNOSIS — I10 Essential (primary) hypertension: Secondary | ICD-10-CM | POA: Diagnosis not present

## 2021-12-19 DIAGNOSIS — Z853 Personal history of malignant neoplasm of breast: Secondary | ICD-10-CM | POA: Diagnosis not present

## 2021-12-19 DIAGNOSIS — D539 Nutritional anemia, unspecified: Secondary | ICD-10-CM

## 2021-12-19 DIAGNOSIS — Z0000 Encounter for general adult medical examination without abnormal findings: Secondary | ICD-10-CM

## 2021-12-19 NOTE — Progress Notes (Unsigned)
Office Visit Note  Patient: April Howell             Date of Birth: 26-May-1946           MRN: [id number]             PCP: Riesa Pope, MD Referring: Janith Lima, MD Visit Date: 01/02/2022 Occupation: @GUAROCC @  Subjective:  No chief complaint on file.   History of Present Illness: April Howell is a 76 y.o. female ***   Activities of Daily Living:  Patient reports morning stiffness for *** {minute/hour:19697}.   Patient {ACTIONS;DENIES/REPORTS:21021675::"Denies"} nocturnal pain.  Difficulty dressing/grooming: {ACTIONS;DENIES/REPORTS:21021675::"Denies"} Difficulty climbing stairs: {ACTIONS;DENIES/REPORTS:21021675::"Denies"} Difficulty getting out of chair: {ACTIONS;DENIES/REPORTS:21021675::"Denies"} Difficulty using hands for taps, buttons, cutlery, and/or writing: {ACTIONS;DENIES/REPORTS:21021675::"Denies"}  No Rheumatology ROS completed.   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   Physical Exam   Musculoskeletal Exam: ***  CDAI Exam: CDAI Score: -- Patient Global: --; Provider Global: -- Swollen: --; Tender: -- Joint Exam 01/02/2022   No joint exam has been documented for this visit   There is currently no information documented on the homunculus. Go to the Rheumatology activity and complete the homunculus joint exam.  Investigation: No additional findings.  Imaging: No results found.  Recent Labs: Lab Results  Component Value Date   WBC 3.4 (L) 10/08/2021   HGB 10.3 (L) 10/08/2021   PLT 232 10/08/2021   NA 128 (L) 10/08/2021   K 3.9 10/08/2021   CL 98 10/08/2021   CO2 25 10/08/2021   GLUCOSE 97 10/08/2021   BUN 15 10/08/2021   CREATININE 0.83 10/08/2021   BILITOT 0.5 10/08/2021   ALKPHOS 52 10/08/2021   AST 36 10/08/2021   ALT 23 10/08/2021   PROT 10.0 (H) 10/08/2021   ALBUMIN 3.2 (L) 10/08/2021   CALCIUM 8.7 (L) 10/08/2021   QFTBGOLDPLUS NEGATIVE 10/10/2021    Speciality Comments: PLQ Eye Exam: 11/20/2021 WNL @ [facility] Follow up in 1 year  Procedures:  No procedures performed Allergies: Patient has no known allergies.   Assessment / Plan:     Visit Diagnoses: No diagnosis found.  Orders: No orders of the defined types  were placed in this encounter.  No orders of the defined types were placed in this encounter.   Face-to-face time spent with patient was *** minutes. Greater than 50% of time was spent in counseling and coordination of care.  Follow-Up Instructions: No follow-ups on file.   Earnestine Mealing, CMA  Note - This record has been created using Editor, commissioning.  Chart creation errors have been sought, but may not always  have been located. Such creation errors do not reflect on  the standard of medical care.

## 2021-12-19 NOTE — Assessment & Plan Note (Signed)
Suspect chronic fatigue due to combination of her recently diagnosed Sjogren's syndrome as well as her anemia.  Hopeful that with her hydroxychloroquine treatment as well as iron supplementation she will start to improve ?

## 2021-12-19 NOTE — Assessment & Plan Note (Signed)
Patient with history of osteoporosis.  She notes in the past she was treated with a bisphosphonate for a few years and then this was discontinued.  I suspect that she had a drug holiday.  She has not had repeat imaging since.  We will repeat DEXA scan to see if she needs continue bisphosphonate therapy. ?

## 2021-12-19 NOTE — Assessment & Plan Note (Signed)
Was being followed by pulmonology, this nodule was thought to be secondary to Sjogren's syndrome we will continue to monitor and repeat imaging as we see fit patient is having weight loss but no night sweats or other B symptoms.  Low suspicion that this is malignant etiology.  Appreciate consultation's assistance in this matter ?

## 2021-12-19 NOTE — Assessment & Plan Note (Signed)
Patient with history of right-sided breast cancer.  Uncertain of documentation from [place] state.  She notes being treated with a mastectomy and tamoxifen.  She is currently in remission.  We will repeat mammogram as she has not had one performed in the past 2 years or so. ?

## 2021-12-19 NOTE — Assessment & Plan Note (Addendum)
Currently being followed by hematology as well as rheumatology.  Thought to be secondary to anemia of chronic disease.  She is also on iron supplementation, ferrous sulfate 45 mg daily. ? ?Of note last colonoscopy was 7 years ago that patient states was unremarkable. ?

## 2021-12-19 NOTE — Patient Instructions (Signed)
Thank you, Ms.Aldina Porta for allowing [place] to provide your care today. Today we discussed . ? ?History of breast cancer ?We will be sending a referral for a mammogram. Please be on the look out for the phone call to schedule this.  ? ?History of Osteoperosis ?They will be calling you to schedule your bone scan.    ? ?I have ordered the following labs for you: ? ?Lab Orders  ?No laboratory test(s) ordered today  ?  ? ? ?Referrals ordered today:  ? ?Referral Orders  ?No referral(s) requested today  ?  ? ?I have ordered the following medication/changed the following medications:  ? ?Stop the following medications: ?There are no discontinued medications.  ? ?Start the following medications: ?No orders of the defined types were placed in this encounter. ?  ? ?Follow up: 4-6 months  ? ?Should you have any questions or concerns please call the internal medicine clinic at [phone].   ? ?Sanjuana Letters, D.O. ?[facility] ?  ?

## 2021-12-19 NOTE — Assessment & Plan Note (Signed)
Patient states she has a history of dry eyes dry mouth, weight loss and fatigue.  She was recently diagnosed with Sjogren's syndrome.  She was on high-dose pilocarpine however felt as though she was sweating too much and did not feel improvement in her dry eyes or dry mouth.  She was recently started on hydroxychloroquine by rheumatology and has only been taking this for around 4 weeks.  She has not felt any improvement since.  She will continue to follow with rheumatology ?

## 2021-12-19 NOTE — Assessment & Plan Note (Signed)
Repeat blood pressure on exam 122/65.  Continue lifestyle modifications, do not believe she needs medications at this time. ?

## 2021-12-19 NOTE — Progress Notes (Signed)
? ?  New Patient Office Visit ? ?Subjective:  ?Patient ID: April Howell, female    DOB: Sep 24, 1946  Age: 76 y.o. MRN: [id number] ? ?CC:  ?Chief Complaint  ?Patient presents with  ? Follow-up  ?  PATIENT IS NEW TO CLINIC TO GET ESTABLISH CARE.  ? ? ?HPI ?April Howell presents to establish care with our clinic.  ? ?Past Medical History:  ?Diagnosis Date  ? Anemia   ? Basal cell carcinoma   ? Breast cancer ([place])   ? 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 Osteopenia   ? Sjogren's syndrome ([place])   ? 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 ? ?Past Surgical History:  ?Procedure Laterality Date  ? MASTECTOMY Right   ? ? ?Family History  ?Problem Relation Age of Onset  ? Multiple sclerosis Mother   ? Heart attack Father   ? Heart disease Father   ? Hodgkin's lymphoma Sister   ? Breast cancer Sister   ? Basal cell carcinoma Sister   ? Healthy Son   ? ? ?Social History  ? ?Socioeconomic History  ? Marital status: Married  ?  Spouse name: Not on file  ? Number of children: Not on file  ? Years of education: Not on file  ? Highest education level: Not on file  ?Occupational History  ? Not on file  ?Tobacco Use  ? Smoking status: Former  ?  Packs/day: 1.00  ?  Years: 4.00  ?  Pack years: 4.00  ?  Types: Cigarettes  ?  Quit date: 74  ?  Years since quitting: 51.1  ? Smokeless tobacco: Never  ?Vaping Use  ? Vaping Use: Never used  ?Substance and Sexual Activity  ? Alcohol use: Yes  ?  Alcohol/week: 7.0 standard drinks  ?  Types: 7 Glasses of wine per week  ? Drug use: Never  ? Sexual activity: Yes  ?  Partners: Male  ?Other Topics Concern  ? Not on file  ?Social History Narrative  ? Not on file  ? ?Social Determinants of Health  ? ?Financial Resource Strain: Not on file  ?Food Insecurity: Not on file  ?Transportation Needs: Not on file  ?Physical Activity: Not on file  ?Stress: Not on file  ?Social Connections: Not on file  ?Intimate Partner Violence: Not on file  ? ? ?ROS ?Review of Systems  ?Constitutional:  Positive for  fatigue.  ?Eyes:   ?     Dry eyes  ?All other systems reviewed and are negative. ? ?Objective:  ? ?Today's Vitals: BP 122/65 (BP Location: Right Arm, Cuff Size: Small)   Pulse 78   Temp 98.1 ?F (36.7 ?C) (Oral)   Ht 5\' 9"  (1.753 m)   Wt 124 lb 3.2 oz (56.3 kg)   SpO2 100%   BMI 18.34 kg/m?  ? ?Constitutional: Well-appearing, no acute distress ?HENT: normocephalic atraumatic ?Eyes: conjunctiva non-erythematous ?Neck: supple ?Cardiovascular: regular rate and rhythm, no m/r/g ?Pulmonary/Chest: normal work of breathing on room air, lungs clear to auscultation bilaterally ?MSK: normal bulk and tone ?Neurological: alert & oriented x 3 ?Skin: warm and dry ?Psych: Normal mood and thought process ? ? ?Assessment & Plan:  ? ?Please see problem list for further assessment and plan.  This case was discussed with Dr. Dareen Piano ? ?Follow-up: Return in about 6 months (around 06/21/2022) for follow up.  ? ?Sanjuana Letters, MD ? ?

## 2021-12-21 ENCOUNTER — Other Ambulatory Visit: Payer: Self-pay | Admitting: *Deleted

## 2021-12-21 DIAGNOSIS — Z79899 Other long term (current) drug therapy: Secondary | ICD-10-CM

## 2021-12-21 NOTE — Progress Notes (Signed)
Internal Medicine Clinic Attending  Case discussed with Dr. Katsadouros  At the time of the visit.  We reviewed the resident's history and exam and pertinent patient test results.  I agree with the assessment, diagnosis, and plan of care documented in the resident's note.  

## 2021-12-22 LAB — CBC WITH DIFFERENTIAL/PLATELET
Absolute Monocytes: 250 cells/uL (ref 200–950)
Basophils Absolute: 39 cells/uL (ref 0–200)
Basophils Relative: 0.8 %
Eosinophils Absolute: 78 cells/uL (ref 15–500)
Eosinophils Relative: 1.6 %
HCT: 31.5 % — ABNORMAL LOW (ref 35.0–45.0)
Hemoglobin: 10.6 g/dL — ABNORMAL LOW (ref 11.7–15.5)
Lymphs Abs: 755 cells/uL — ABNORMAL LOW (ref 850–3900)
MCH: 31.8 pg (ref 27.0–33.0)
MCHC: 33.7 g/dL (ref 32.0–36.0)
MCV: 94.6 fL (ref 80.0–100.0)
MPV: 8.9 fL (ref 7.5–12.5)
Monocytes Relative: 5.1 %
Neutro Abs: 3778 cells/uL (ref 1500–7800)
Neutrophils Relative %: 77.1 %
Platelets: 240 10*3/uL (ref 140–400)
RBC: 3.33 10*6/uL — ABNORMAL LOW (ref 3.80–5.10)
RDW: 11.9 % (ref 11.0–15.0)
Total Lymphocyte: 15.4 %
WBC: 4.9 10*3/uL (ref 3.8–10.8)

## 2021-12-22 LAB — COMPLETE METABOLIC PANEL WITH GFR
AG Ratio: 0.6 (calc) — ABNORMAL LOW (ref 1.0–2.5)
ALT: 18 U/L (ref 6–29)
AST: 28 U/L (ref 10–35)
Albumin: 3.4 g/dL — ABNORMAL LOW (ref 3.6–5.1)
Alkaline phosphatase (APISO): 48 U/L (ref 37–153)
BUN: 14 mg/dL (ref 7–25)
CO2: 27 mmol/L (ref 20–32)
Calcium: 9.3 mg/dL (ref 8.6–10.4)
Chloride: 90 mmol/L — ABNORMAL LOW (ref 98–110)
Creat: 0.81 mg/dL (ref 0.60–1.00)
Globulin: 6 g/dL (calc) — ABNORMAL HIGH (ref 1.9–3.7)
Glucose, Bld: 96 mg/dL (ref 65–99)
Potassium: 4.5 mmol/L (ref 3.5–5.3)
Sodium: 124 mmol/L — ABNORMAL LOW (ref 135–146)
Total Bilirubin: 0.6 mg/dL (ref 0.2–1.2)
Total Protein: 9.4 g/dL — ABNORMAL HIGH (ref 6.1–8.1)
eGFR: 76 mL/min/{1.73_m2} (ref >=60)

## 2021-12-23 NOTE — Progress Notes (Signed)
Please evaluate for hyponatremia .

## 2021-12-23 NOTE — Progress Notes (Signed)
Hemoglobin is low and stable.  Sodium and chloride are low.  Total protein is still elevated.

## 2021-12-24 ENCOUNTER — Encounter: Payer: Self-pay | Admitting: Student

## 2021-12-24 ENCOUNTER — Other Ambulatory Visit: Payer: Self-pay | Admitting: Student

## 2021-12-24 DIAGNOSIS — E871 Hypo-osmolality and hyponatremia: Secondary | ICD-10-CM

## 2021-12-28 ENCOUNTER — Ambulatory Visit
Admission: RE | Admit: 2021-12-28 | Discharge: 2021-12-28 | Disposition: A | Discharge status: Home / Self Care | Payer: Medicare Other | Source: Ambulatory Visit | Attending: Internal Medicine | Admitting: Internal Medicine

## 2021-12-28 DIAGNOSIS — Z0000 Encounter for general adult medical examination without abnormal findings: Secondary | ICD-10-CM

## 2021-12-28 DIAGNOSIS — Z1231 Encounter for screening mammogram for malignant neoplasm of breast: Secondary | ICD-10-CM

## 2021-12-31 ENCOUNTER — Other Ambulatory Visit: Payer: Medicare Other

## 2021-12-31 DIAGNOSIS — E871 Hypo-osmolality and hyponatremia: Secondary | ICD-10-CM

## 2022-01-01 LAB — BMP8+ANION GAP
Anion Gap: 10 mmol/L (ref 10.0–18.0)
BUN/Creatinine Ratio: 19 (ref 12–28)
BUN: 17 mg/dL (ref 8–27)
CO2: 26 mmol/L (ref 20–29)
Calcium: 9.5 mg/dL (ref 8.7–10.3)
Chloride: 91 mmol/L — ABNORMAL LOW (ref 96–106)
Creatinine, Ser: 0.9 mg/dL (ref 0.57–1.00)
Glucose: 105 mg/dL — ABNORMAL HIGH (ref 70–99)
Potassium: 5.1 mmol/L (ref 3.5–5.2)
Sodium: 127 mmol/L — ABNORMAL LOW (ref 134–144)
eGFR: 67 mL/min/{1.73_m2} (ref >59)

## 2022-01-01 LAB — OSMOLALITY: Osmolality Meas: 273 mOsmol/kg — ABNORMAL LOW (ref 280–301)

## 2022-01-02 ENCOUNTER — Ambulatory Visit (INDEPENDENT_AMBULATORY_CARE_PROVIDER_SITE_OTHER): Payer: Medicare Other | Admitting: Physician Assistant

## 2022-01-02 ENCOUNTER — Other Ambulatory Visit: Payer: Self-pay

## 2022-01-02 ENCOUNTER — Encounter: Payer: Self-pay | Admitting: Physician Assistant

## 2022-01-02 VITALS — BP 137/69 | HR 77 | Ht 69.75 in | Wt 122.6 lb

## 2022-01-02 DIAGNOSIS — Z79899 Other long term (current) drug therapy: Secondary | ICD-10-CM | POA: Diagnosis not present

## 2022-01-02 DIAGNOSIS — M3509 Sicca syndrome with other organ involvement: Secondary | ICD-10-CM | POA: Diagnosis not present

## 2022-01-02 DIAGNOSIS — M8589 Other specified disorders of bone density and structure, multiple sites: Secondary | ICD-10-CM

## 2022-01-02 DIAGNOSIS — R5382 Chronic fatigue, unspecified: Secondary | ICD-10-CM

## 2022-01-02 DIAGNOSIS — R700 Elevated erythrocyte sedimentation rate: Secondary | ICD-10-CM | POA: Diagnosis not present

## 2022-01-02 DIAGNOSIS — I10 Essential (primary) hypertension: Secondary | ICD-10-CM

## 2022-01-02 DIAGNOSIS — R778 Other specified abnormalities of plasma proteins: Secondary | ICD-10-CM

## 2022-01-02 DIAGNOSIS — D508 Other iron deficiency anemias: Secondary | ICD-10-CM

## 2022-01-02 DIAGNOSIS — R634 Abnormal weight loss: Secondary | ICD-10-CM

## 2022-01-02 DIAGNOSIS — D692 Other nonthrombocytopenic purpura: Secondary | ICD-10-CM

## 2022-01-02 DIAGNOSIS — R911 Solitary pulmonary nodule: Secondary | ICD-10-CM

## 2022-01-02 DIAGNOSIS — Z820 Family history of epilepsy and other diseases of the nervous system: Secondary | ICD-10-CM

## 2022-01-02 DIAGNOSIS — J479 Bronchiectasis, uncomplicated: Secondary | ICD-10-CM

## 2022-01-02 DIAGNOSIS — Z853 Personal history of malignant neoplasm of breast: Secondary | ICD-10-CM

## 2022-01-02 LAB — OSMOLALITY, URINE: Osmolality, Ur: 461 mOsmol/kg

## 2022-01-02 LAB — SODIUM, URINE, RANDOM: Sodium, Ur: 30 mmol/L

## 2022-01-04 ENCOUNTER — Encounter: Payer: Self-pay | Admitting: Student

## 2022-01-04 DIAGNOSIS — E871 Hypo-osmolality and hyponatremia: Secondary | ICD-10-CM | POA: Insufficient documentation

## 2022-01-04 DIAGNOSIS — L989 Disorder of the skin and subcutaneous tissue, unspecified: Secondary | ICD-10-CM

## 2022-01-04 NOTE — Assessment & Plan Note (Addendum)
Assessment: ?Patient with persistent hyponatremia. 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 With her history of pulmonary nodule and bronchiectasis, possible pulmonary process. Will reach out to Dr. Erin Fulling to get his input on this.  ? ?Plan: ?-discuss with patient's pulmonologist ?-continue to monitor and follow ?

## 2022-01-08 ENCOUNTER — Other Ambulatory Visit: Payer: Self-pay | Admitting: Internal Medicine

## 2022-01-08 ENCOUNTER — Other Ambulatory Visit: Payer: Self-pay | Admitting: Student

## 2022-01-08 DIAGNOSIS — R928 Other abnormal and inconclusive findings on diagnostic imaging of breast: Secondary | ICD-10-CM

## 2022-01-11 ENCOUNTER — Ambulatory Visit
Admission: RE | Admit: 2022-01-11 | Discharge: 2022-01-11 | Disposition: A | Discharge status: Home / Self Care | Payer: Medicare Other | Source: Ambulatory Visit | Attending: Internal Medicine | Admitting: Internal Medicine

## 2022-01-11 ENCOUNTER — Other Ambulatory Visit: Payer: Self-pay | Admitting: Student

## 2022-01-11 DIAGNOSIS — R928 Other abnormal and inconclusive findings on diagnostic imaging of breast: Secondary | ICD-10-CM

## 2022-01-11 DIAGNOSIS — R921 Mammographic calcification found on diagnostic imaging of breast: Secondary | ICD-10-CM

## 2022-01-22 ENCOUNTER — Other Ambulatory Visit: Payer: Self-pay | Admitting: Student

## 2022-01-22 DIAGNOSIS — R921 Mammographic calcification found on diagnostic imaging of breast: Secondary | ICD-10-CM

## 2022-01-28 ENCOUNTER — Ambulatory Visit
Admission: RE | Admit: 2022-01-28 | Discharge: 2022-01-28 | Disposition: A | Discharge status: Home / Self Care | Payer: Medicare Other | Source: Ambulatory Visit | Attending: Internal Medicine | Admitting: Internal Medicine

## 2022-01-28 DIAGNOSIS — R921 Mammographic calcification found on diagnostic imaging of breast: Secondary | ICD-10-CM

## 2022-02-08 ENCOUNTER — Other Ambulatory Visit: Payer: Self-pay | Admitting: Physician Assistant

## 2022-02-08 NOTE — Telephone Encounter (Signed)
Next Visit: 06/04/2022 ? ?Last Visit: 01/02/2022 ? ?Labs: 12/21/2021 Hemoglobin is low and stable.  Sodium and chloride are low.  Total protein is still elevated. ? ?Eye exam: 11/20/2021 WNL   ? ?Current Dose per office note 01/02/2022: Plaquenil 200 mg twice daily Monday through Friday ? ?DX: Sjogren's syndrome with other organ involvement  ? ?Last Fill: 11/22/2021 ? ?Okay to refill Plaquenil?  ?

## 2022-03-20 ENCOUNTER — Telehealth: Payer: Self-pay | Admitting: Hematology and Oncology

## 2022-03-20 NOTE — Telephone Encounter (Signed)
Called patient regarding upcoming June appointments, patient is notified.  

## 2022-04-03 ENCOUNTER — Other Ambulatory Visit: Payer: Self-pay | Admitting: Student

## 2022-04-03 DIAGNOSIS — M810 Age-related osteoporosis without current pathological fracture: Secondary | ICD-10-CM

## 2022-04-08 ENCOUNTER — Other Ambulatory Visit: Payer: Self-pay | Admitting: Lab

## 2022-04-08 ENCOUNTER — Inpatient Hospital Stay: Payer: Medicare Other | Attending: Hematology and Oncology

## 2022-04-08 ENCOUNTER — Other Ambulatory Visit: Payer: Self-pay

## 2022-04-08 ENCOUNTER — Ambulatory Visit: Payer: PRIVATE HEALTH INSURANCE | Admitting: Hematology and Oncology

## 2022-04-08 ENCOUNTER — Other Ambulatory Visit: Payer: PRIVATE HEALTH INSURANCE

## 2022-04-08 ENCOUNTER — Other Ambulatory Visit: Payer: Self-pay | Admitting: Hematology and Oncology

## 2022-04-08 ENCOUNTER — Inpatient Hospital Stay (HOSPITAL_BASED_OUTPATIENT_CLINIC_OR_DEPARTMENT_OTHER): Payer: Medicare Other | Admitting: Hematology and Oncology

## 2022-04-08 VITALS — BP 124/72 | HR 76 | Temp 98.1°F | Resp 15 | Ht 69.75 in | Wt 125.8 lb

## 2022-04-08 DIAGNOSIS — Z85828 Personal history of other malignant neoplasm of skin: Secondary | ICD-10-CM | POA: Insufficient documentation

## 2022-04-08 DIAGNOSIS — R911 Solitary pulmonary nodule: Secondary | ICD-10-CM | POA: Diagnosis not present

## 2022-04-08 DIAGNOSIS — Z803 Family history of malignant neoplasm of breast: Secondary | ICD-10-CM | POA: Insufficient documentation

## 2022-04-08 DIAGNOSIS — R778 Other specified abnormalities of plasma proteins: Secondary | ICD-10-CM | POA: Diagnosis not present

## 2022-04-08 DIAGNOSIS — D638 Anemia in other chronic diseases classified elsewhere: Secondary | ICD-10-CM | POA: Diagnosis not present

## 2022-04-08 DIAGNOSIS — R779 Abnormality of plasma protein, unspecified: Secondary | ICD-10-CM | POA: Insufficient documentation

## 2022-04-08 DIAGNOSIS — D500 Iron deficiency anemia secondary to blood loss (chronic): Secondary | ICD-10-CM

## 2022-04-08 DIAGNOSIS — M8580 Other specified disorders of bone density and structure, unspecified site: Secondary | ICD-10-CM | POA: Insufficient documentation

## 2022-04-08 DIAGNOSIS — D509 Iron deficiency anemia, unspecified: Secondary | ICD-10-CM | POA: Insufficient documentation

## 2022-04-08 DIAGNOSIS — Z87891 Personal history of nicotine dependence: Secondary | ICD-10-CM | POA: Diagnosis not present

## 2022-04-08 DIAGNOSIS — Z853 Personal history of malignant neoplasm of breast: Secondary | ICD-10-CM | POA: Diagnosis not present

## 2022-04-08 LAB — CBC WITH DIFFERENTIAL (CANCER CENTER ONLY)
Abs Immature Granulocytes: 0.01 10*3/uL (ref 0.00–0.07)
Basophils Absolute: 0 10*3/uL (ref 0.0–0.1)
Basophils Relative: 1 %
Eosinophils Absolute: 0.1 10*3/uL (ref 0.0–0.5)
Eosinophils Relative: 1 %
HCT: 32.4 % — ABNORMAL LOW (ref 36.0–46.0)
Hemoglobin: 11.2 g/dL — ABNORMAL LOW (ref 12.0–15.0)
Immature Granulocytes: 0 %
Lymphocytes Relative: 18 %
Lymphs Abs: 0.7 10*3/uL (ref 0.7–4.0)
MCH: 33.8 pg (ref 26.0–34.0)
MCHC: 34.6 g/dL (ref 30.0–36.0)
MCV: 97.9 fL (ref 80.0–100.0)
Monocytes Absolute: 0.4 10*3/uL (ref 0.1–1.0)
Monocytes Relative: 10 %
Neutro Abs: 2.8 10*3/uL (ref 1.7–7.7)
Neutrophils Relative %: 70 %
Platelet Count: 219 10*3/uL (ref 150–400)
RBC: 3.31 MIL/uL — ABNORMAL LOW (ref 3.87–5.11)
RDW: 13.1 % (ref 11.5–15.5)
WBC Count: 4 10*3/uL (ref 4.0–10.5)
nRBC: 0 % (ref 0.0–0.2)

## 2022-04-08 LAB — CMP (CANCER CENTER ONLY)
ALT: 16 U/L (ref 0–44)
AST: 27 U/L (ref 15–41)
Albumin: 3.8 g/dL (ref 3.5–5.0)
Alkaline Phosphatase: 46 U/L (ref 38–126)
Anion gap: 5 (ref 5–15)
BUN: 14 mg/dL (ref 8–23)
CO2: 29 mmol/L (ref 22–32)
Calcium: 9.6 mg/dL (ref 8.9–10.3)
Chloride: 93 mmol/L — ABNORMAL LOW (ref 98–111)
Creatinine: 0.79 mg/dL (ref 0.44–1.00)
GFR, Estimated: >60 mL/min (ref >60)
Glucose, Bld: 109 mg/dL — ABNORMAL HIGH (ref 70–99)
Potassium: 3.8 mmol/L (ref 3.5–5.1)
Sodium: 127 mmol/L — ABNORMAL LOW (ref 135–145)
Total Bilirubin: 0.5 mg/dL (ref 0.3–1.2)
Total Protein: 9.7 g/dL — ABNORMAL HIGH (ref 6.5–8.1)

## 2022-04-08 LAB — RETIC PANEL
Immature Retic Fract: 7.8 % (ref 2.3–15.9)
RBC.: 3.38 MIL/uL — ABNORMAL LOW (ref 3.87–5.11)
Retic Count, Absolute: 61.9 10*3/uL (ref 19.0–186.0)
Retic Ct Pct: 1.8 % (ref 0.4–3.1)
Reticulocyte Hemoglobin: 35.7 pg (ref >27.9)

## 2022-04-08 LAB — IRON AND IRON BINDING CAPACITY (CC-WL,HP ONLY)
Iron: 99 ug/dL (ref 28–170)
Saturation Ratios: 26 % (ref 10.4–31.8)
TIBC: 377 ug/dL (ref 250–450)
UIBC: 278 ug/dL (ref 148–442)

## 2022-04-08 LAB — FERRITIN: Ferritin: 47 ng/mL (ref 11–307)

## 2022-04-08 NOTE — Progress Notes (Signed)
[facility] Telephone:([phone]   [phone]) [phone]  PROGRESS NOTE  Patient Care Team: Riesa Pope, MD as PCP - General (Internal Medicine)  Hematological/Oncological History # Lung Nodules  06/14/2021: CXR showed irregular nodular density is noted in right upper lobe during workup for productive cough x 3 months.  06/22/2021: establish care with Dr. Lorenso Courier  07/17/2021: CT scan shows peribronchovascular nodularity and mucoid impaction, likely explaining the concerning 'lung mass'  #Iron Deficiency Anemia/Anemia of Chronic Disease 04/08/2022: WBC 4.0, Hgb 11.2, MCV 97.9, Plt 219  Interval History:  April Howell 76 y.o. female with medical history significant for lung nodules and iron deficiency anemia who presents for a follow up visit. The patient's last visit was on 04/08/2022.   On exam today April Howell reports that she is "getting better all the time".  She notes her energy levels are coming back.  She notes she continues to take iron pills and has been taking 1 pill a day in the morning.  She was taking pilocarpine but it only "made her sweat".  She has been taking Plaquenil and has been working quite well for her.  She has noted that she has not been having any issues with bleeding, bruising, or dark stools.  She also notes she is tolerating iron pills well without any constipation or stomach upset.  She currently ranks her energy as 8 out of 10.  She has upcoming cataract surgery in July and August.  Overall she feels well and has no comments questions or concerns at this time.  Otherwise denies any fevers, chills, sweats, nausea, vomiting or diarrhea.  A full 10 point ROS is listed below.  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     SURGICAL  HISTORY: Past Surgical History:  Procedure Laterality Date   MASTECTOMY Right     SOCIAL HISTORY: Social History   Socioeconomic History   Marital status: Married    Spouse name: Not on file   Number of children: Not on file   Years of education: Not on file   Highest education level: Not on file  Occupational History   Not on file  Tobacco Use   Smoking status: Former    Packs/day: 1.00    Years: 4.00    Total pack years: 4.00    Types: Cigarettes    Quit date: 48    Years since quitting: 51.5    Passive exposure: Past   Smokeless tobacco: Never  Vaping Use   Vaping Use: Never used  Substance and Sexual Activity   Alcohol use: Yes    Alcohol/week: 7.0 standard drinks of alcohol    Types: 7 Glasses of wine per week   Drug use: Never   Sexual activity: Yes    Partners: Male  Other Topics Concern   Not on file  Social History Narrative   Not on file   Social Determinants of Health   Financial Resource Strain: Not on file  Food Insecurity: Not on file  Transportation Needs: Not on file  Physical Activity: Not on file  Stress: Not on file  Social Connections: Not on file  Intimate Partner Violence: Not on file    FAMILY HISTORY: Family History  Problem Relation Age  of Onset   Multiple sclerosis Mother    Heart attack Father    Heart disease Father    Hodgkin's lymphoma Sister    Breast cancer Sister 37   Basal cell carcinoma Sister    Healthy Son     ALLERGIES:  has No Known Allergies.  MEDICATIONS:  Current Outpatient Medications  Medication Sig Dispense Refill   hydroxychloroquine (PLAQUENIL) 200 MG tablet TAKE ONE TABLET BY MOUTH TWICE DAILY, MONDAYS THRU FRIDAYS 120 tablet 0   albuterol (PROVENTIL) (2.5 MG/3ML) 0.083% nebulizer solution Take 3 mLs (2.5 mg total) by nebulization in the morning and at bedtime. (Patient not taking: Reported on 10/30/2021) 180 mL 11   CALCIUM PO Take 650 mg by mouth daily.     ferrous sulfate 325 (65 FE) MG EC  tablet Take 325 mg by mouth daily with breakfast.     Ferrous Sulfate Dried (SLOW RELEASE IRON) 45 MG TBCR Take by mouth. (Patient not taking: Reported on 01/02/2022)     Multiple Vitamin (MULTIVITAMIN) tablet Take 1 tablet by mouth daily.     Omega-3 Fatty Acids (FISH OIL) 1000 MG CAPS Take by mouth.     pilocarpine (SALAGEN) 5 MG tablet Take 1 tablet (5 mg total) by mouth 3 (three) times daily as needed. 90 tablet 2   No current facility-administered medications for this visit.    REVIEW OF SYSTEMS:   Constitutional: ( - ) fevers, ( - )  chills , ( - ) night sweats Eyes: ( - ) blurriness of vision, ( - ) double vision, ( - ) watery eyes Ears, nose, mouth, throat, and face: ( - ) mucositis, ( - ) sore throat Respiratory: ( - ) cough, ( - ) dyspnea, ( - ) wheezes Cardiovascular: ( - ) palpitation, ( - ) chest discomfort, ( - ) lower extremity swelling Gastrointestinal:  ( - ) nausea, ( - ) heartburn, ( - ) change in bowel habits Skin: ( - ) abnormal skin rashes Lymphatics: ( - ) new lymphadenopathy, ( - ) easy bruising Neurological: ( - ) numbness, ( - ) tingling, ( - ) new weaknesses Behavioral/Psych: ( - ) mood change, ( - ) new changes  All other systems were reviewed with the patient and are negative.  PHYSICAL EXAMINATION:  Vitals:   04/08/22 1426  BP: 124/72  Pulse: 76  Resp: 15  Temp: 98.1 F (36.7 C)  SpO2: 99%   Filed Weights   04/08/22 1426  Weight: 125 lb 12.8 oz (57.1 kg)    GENERAL: Well-appearing elderly Caucasian female, alert, no distress and comfortable SKIN: skin color, texture, turgor are normal, no rashes or significant lesions EYES: conjunctiva are pink and non-injected, sclera clear LUNGS: clear to auscultation and percussion with normal breathing effort HEART: regular rate & rhythm and no murmurs and no lower extremity edema Musculoskeletal: no cyanosis of digits and no clubbing  PSYCH: alert & oriented x 3, fluent speech NEURO: no focal  motor/sensory deficits  LABORATORY DATA:  I have reviewed the data as listed    Latest Ref Rng & Units 04/08/2022    1:53 PM 12/21/2021    1:50 PM 10/08/2021    2:21 PM  CBC  WBC 4.0 - 10.5 K/uL 4.0  4.9  3.4   Hemoglobin 12.0 - 15.0 g/dL 11.2  10.6  10.3   Hematocrit 36.0 - 46.0 % 32.4  31.5  31.1   Platelets 150 - 400 K/uL 219  240  232  Latest Ref Rng & Units 04/08/2022    1:53 PM 12/31/2021    2:30 PM 12/21/2021    1:50 PM  CMP  Glucose 70 - 99 mg/dL 109  105  96   BUN 8 - 23 mg/dL '14  17  14   '$ Creatinine 0.44 - 1.00 mg/dL 0.79  0.90  0.81   Sodium 135 - 145 mmol/L 127  127  124   Potassium 3.5 - 5.1 mmol/L 3.8  5.1  4.5   Chloride 98 - 111 mmol/L 93  91  90   CO2 22 - 32 mmol/L '29  26  27   '$ Calcium 8.9 - 10.3 mg/dL 9.6  9.5  9.3   Total Protein 6.5 - 8.1 g/dL 9.7   9.4   Total Bilirubin 0.3 - 1.2 mg/dL 0.5   0.6   Alkaline Phos 38 - 126 U/L 46     AST 15 - 41 U/L 27   28   ALT 0 - 44 U/L 16   18     Lab Results  Component Value Date   MPROTEIN Not Observed 10/08/2021   MPROTEIN Not Observed 06/22/2021   Lab Results  Component Value Date   KPAFRELGTCHN 113.8 (H) 10/08/2021   KPAFRELGTCHN 116.0 (H) 06/22/2021   LAMBDASER 63.1 (H) 10/08/2021   LAMBDASER 68.8 (H) 06/22/2021   KAPLAMBRATIO 1.80 (H) 10/08/2021   KAPLAMBRATIO 1.69 (H) 06/22/2021    RADIOGRAPHIC STUDIES: No results found.  ASSESSMENT & PLAN April Howell 76 y.o. female with medical history significant for lung nodules and iron deficiency anemia who presents for a follow up visit.  #Normocytic Anemia #Iron deficiency anemia --labs today show hemoglobin 11.2, MCV 97.9, and platelets of 219.  May be component of anemia of chronic disease/inflammation driving down the hemoglobin. --Ferritin level 65, saturation 30%, serum iron 104 on 10/08/2021.  Iron levels pending from today. --continue ferrous sulfate 325 mg p.o. daily. -- Return to clinic in 6 months time to reevaluate.  #Lung  Nodule --findings initially on Xray, CT scan on 07/17/2021 does not show a mass but concern for atypical infection --Patient has established care with pulmonology for evaluation and management of this possible atypical infection. --continue to monitor   #Elevated Serum Protein -- no evidence of a monoclonal protein on initial labs and our workup. --continue to monitor  No orders of the defined types were placed in this encounter.  All questions were answered. The patient knows to call the clinic with any problems, questions or concerns.  A total of more than 30 minutes were spent on this encounter with face-to-face time and non-face-to-face time, including preparing to see the patient, ordering tests and/or medications, counseling the patient and coordination of care as outlined above.   Ledell Peoples, MD Department of Hematology/Oncology [facility] Phone: [phone] Pager: [phone] Email: [email or web address]  04/09/2022 2:25 PM

## 2022-05-01 ENCOUNTER — Other Ambulatory Visit: Payer: Self-pay | Admitting: Physician Assistant

## 2022-05-01 ENCOUNTER — Telehealth: Payer: Self-pay | Admitting: Student

## 2022-05-01 NOTE — Telephone Encounter (Signed)
Opened in error

## 2022-05-01 NOTE — Telephone Encounter (Signed)
Next Visit: 06/04/2022   Last Visit: 01/02/2022   Labs: 04/08/2022 RBC 3.31, Hgb 11.2, Hct 32.4, Sodium 127, Chloride 93, Glucose 109, Total Protein 9.7   Eye exam: 11/20/2021 WNL     Current Dose per office note 01/02/2022: Plaquenil 200 mg twice daily Monday through Friday   DX: Sjogren's syndrome with other organ involvement    Last Fill: 02/08/2022   Okay to refill Plaquenil?

## 2022-05-21 NOTE — Progress Notes (Signed)
Office Visit Note  Patient: April Howell             Date of Birth: [birth year]           MRN: [id number]             PCP: Riesa Pope, MD Referring: Riesa Pope, * Visit Date: 06/04/2022 Occupation: '@GUAROCC'$ @  Subjective:  Dry mouth and dry eyes  History of Present Illness: April Howell is a 76 y.o. female with history of Sjogren's syndrome.  She states she tried pilocarpine but did not like the side effects of increased sweating.  She has been on hydroxychloroquine since February 2023.  She has noticed improvement in her fatigue.  She denies any further weight loss.  She denies any shortness of breath.  She still has some cough due to bronchiectasis.  She has been followed by Dr. Lorenso Courier for elevated sed rate and abnormal SPEP.  Activities of Daily Living:  Patient reports morning stiffness for 0 minutes.   Patient Denies nocturnal pain.  Difficulty dressing/grooming: Denies Difficulty climbing stairs: Denies Difficulty getting out of chair: Denies Difficulty using hands for taps, buttons, cutlery, and/or writing: Denies  Review of Systems  Constitutional:  Positive for fatigue.  HENT:  Positive for mouth dryness. Negative for mouth sores.   Eyes:  Positive for dryness.  Respiratory:  Negative for shortness of breath.   Cardiovascular:  Negative for chest pain and palpitations.  Gastrointestinal:  Negative for blood in stool, constipation and diarrhea.  Endocrine: Negative for increased urination.  Genitourinary:  Negative for involuntary urination.  Musculoskeletal:  Negative for joint pain, gait problem, joint pain, joint swelling, myalgias, muscle weakness, morning stiffness, muscle tenderness and myalgias.  Skin:  Positive for sensitivity to sunlight. Negative for color change, rash and hair loss.  Allergic/Immunologic: Negative for susceptible to infections.  Neurological:  Negative for dizziness and headaches.  Hematological:  Negative for swollen  glands.  Psychiatric/Behavioral:  Negative for depressed mood and sleep disturbance. The patient is not nervous/anxious.     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 06/15/2021   Nodule of upper lobe of right lung 06/15/2021   Purpura ([place]) 06/14/2021   Chronic fatigue 06/14/2021   Primary hypertension 06/14/2021   Cough productive of purulent sputum 06/14/2021   Abdominal bruit 06/14/2021   Elevated total protein 06/14/2021    Past Medical History:  Diagnosis Date   Anemia    Basal cell carcinoma    Breast cancer ([place])    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   Osteopenia    Sjogren's syndrome (HCC)    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     Family History  Problem Relation Age of Onset   Multiple sclerosis Mother    Heart attack Father    Heart disease Father    Hodgkin's lymphoma Sister    Breast cancer Sister 25   Basal cell carcinoma Sister    Healthy Son    Past Surgical History:  Procedure Laterality Date   CATARACT EXTRACTION, BILATERAL Bilateral    05/06/2022 and 05/27/2022   MASTECTOMY Right    Social History   Social History Narrative   Not on file   Immunization History  Administered Date(s) Administered   PFIZER(Purple Top)SARS-COV-2 Vaccination 11/12/2019,  12/03/2019, 07/19/2020, 02/14/2021     Objective: Vital Signs: BP 106/67 (BP Location: Left Arm, Patient Position: Sitting, Cuff Size: Normal)   Pulse 80   Resp 13   Ht 5' 9.75" (1.772 m)   Wt 125 lb 3.2 oz (56.8 kg)   BMI 18.09 kg/m    Physical Exam Vitals and nursing note reviewed.  Constitutional:      Appearance: She is well-developed.  HENT:     Head: Normocephalic and atraumatic.  Eyes:     Conjunctiva/sclera: Conjunctivae normal.  Cardiovascular:      Rate and Rhythm: Normal rate and regular rhythm.     Heart sounds: Normal heart sounds.  Pulmonary:     Effort: Pulmonary effort is normal.     Breath sounds: Normal breath sounds.  Abdominal:     General: Bowel sounds are normal.     Palpations: Abdomen is soft.  Musculoskeletal:     Cervical back: Normal range of motion.  Lymphadenopathy:     Cervical: No cervical adenopathy.  Skin:    General: Skin is warm and dry.     Capillary Refill: Capillary refill takes less than 2 seconds.  Neurological:     Mental Status: She is alert and oriented to person, place, and time.  Psychiatric:        Behavior: Behavior normal.      Musculoskeletal Exam: C-spine was in good range of motion.  Shoulder joints, elbow joints, wrist joints, MCPs PIPs and DIPs with good range of motion with no synovitis.  Hip joints, knee joints with good range of motion.  There was no tenderness over ankles or MTPs.  CDAI Exam: CDAI Score: -- Patient Global: --; Provider Global: -- Swollen: --; Tender: -- Joint Exam 06/04/2022   No joint exam has been documented for this visit   There is currently no information documented on the homunculus. Go to the Rheumatology activity and complete the homunculus joint exam.  Investigation: No additional findings.  Imaging: No results found.  Recent Labs: Lab Results  Component Value Date   WBC 4.0 04/08/2022   HGB 11.2 (L) 04/08/2022   PLT 219 04/08/2022   NA 127 (L) 04/08/2022   K 3.8 04/08/2022   CL 93 (L) 04/08/2022   CO2 29 04/08/2022   GLUCOSE 109 (H) 04/08/2022   BUN 14 04/08/2022   CREATININE 0.79 04/08/2022   BILITOT 0.5 04/08/2022   ALKPHOS 46 04/08/2022   AST 27 04/08/2022   ALT 16 04/08/2022   PROT 9.7 (H) 04/08/2022   ALBUMIN 3.8 04/08/2022   CALCIUM 9.6 04/08/2022   QFTBGOLDPLUS NEGATIVE 10/10/2021    Speciality Comments: PLQ Eye Exam: 11/20/2021 WNL @ [place] Follow up in 1 year PLQ started 02/202/20223  Procedures:  No  procedures performed Allergies: Patient has no known allergies.   Assessment / Plan:     Visit Diagnoses: Sjogren's syndrome with other organ involvement ([place]) - Positive ANA, positive Ro, positive La, positive RF, sicca symptoms (dry mouth, dry eyes, dry skin, vaginal dryness): -Over-the-counter products were discussed at length.  She has noticed improvement in her symptoms since she has been taking hydroxychloroquine.  She could not tolerate pilocarpine.  I will obtain following labs in November.  Plan: Urinalysis, Routine w reflex microscopic, C3 and C4, Rheumatoid factor, Sjogrens syndrome-A extractable nuclear antibody, Rheumatoid factor  High risk medication use - Plaquenil 200 mg twice daily Monday through Friday.  PLQ Eye Exam: 11/20/2021 - Plan: CBC with Differential/Platelet, COMPLETE METABOLIC  PANEL WITH GFR in November and every 5 months.  Elevated sed rate - Patient is followed by Dr. Lorenso Courier.   Chronic fatigue -she noticed improvement in fatigue since she has been on hydroxychloroquine.   Primary hypertension-her blood pressure was normal today.  Bronchiectasis without complication ([place]) - Followed by Dr. Erin Fulling.  She could history of chronic cough.  Nodule of upper lobe of right lung  Senile purpura (HCC)-patient with evaluated by dermatology and was diagnosed with senile purpura per patient.  Abnormal SPEP - Followed by Dr. Lorenso Courier.  Other iron deficiency anemia-followed by Dr. Lorenso Courier.  Osteopenia of multiple sites - Previously treated with bisphosphonates.  Updated DEXA scheduled in 08/2022.  Use of calcium rich diet and vitamin D was advised.  Resistive exercises were discussed.  History of breast cancer - Right mastectomy 2008, status postchemotherapy only.  PCP plans to update mammogram.  Family history of multiple sclerosis - Mother.  Orders: Orders Placed This Encounter  Procedures   Urinalysis, Routine w reflex microscopic   CBC with Differential/Platelet    COMPLETE METABOLIC PANEL WITH GFR   C3 and C4   Rheumatoid factor   Sjogrens syndrome-A extractable nuclear antibody   Rheumatoid factor   No orders of the defined types were placed in this encounter.    Follow-Up Instructions: Return in about 4 months (around 10/04/2022) for Sjogren's.   Bo Merino, MD  Note - This record has been created using Editor, commissioning.  Chart creation errors have been sought, but may not always  have been located. Such creation errors do not reflect on  the standard of medical care.

## 2022-05-28 ENCOUNTER — Other Ambulatory Visit: Payer: PRIVATE HEALTH INSURANCE

## 2022-06-04 ENCOUNTER — Encounter: Payer: Self-pay | Admitting: Rheumatology

## 2022-06-04 ENCOUNTER — Ambulatory Visit: Payer: Medicare Other | Attending: Rheumatology | Admitting: Rheumatology

## 2022-06-04 VITALS — BP 106/67 | HR 80 | Resp 13 | Ht 69.75 in | Wt 125.2 lb

## 2022-06-04 DIAGNOSIS — Z79899 Other long term (current) drug therapy: Secondary | ICD-10-CM | POA: Diagnosis present

## 2022-06-04 DIAGNOSIS — R778 Other specified abnormalities of plasma proteins: Secondary | ICD-10-CM | POA: Diagnosis present

## 2022-06-04 DIAGNOSIS — Z853 Personal history of malignant neoplasm of breast: Secondary | ICD-10-CM | POA: Diagnosis present

## 2022-06-04 DIAGNOSIS — M8589 Other specified disorders of bone density and structure, multiple sites: Secondary | ICD-10-CM

## 2022-06-04 DIAGNOSIS — M3509 Sicca syndrome with other organ involvement: Secondary | ICD-10-CM | POA: Diagnosis present

## 2022-06-04 DIAGNOSIS — J479 Bronchiectasis, uncomplicated: Secondary | ICD-10-CM

## 2022-06-04 DIAGNOSIS — I10 Essential (primary) hypertension: Secondary | ICD-10-CM

## 2022-06-04 DIAGNOSIS — R5382 Chronic fatigue, unspecified: Secondary | ICD-10-CM

## 2022-06-04 DIAGNOSIS — R634 Abnormal weight loss: Secondary | ICD-10-CM

## 2022-06-04 DIAGNOSIS — Z820 Family history of epilepsy and other diseases of the nervous system: Secondary | ICD-10-CM

## 2022-06-04 DIAGNOSIS — D508 Other iron deficiency anemias: Secondary | ICD-10-CM | POA: Diagnosis present

## 2022-06-04 DIAGNOSIS — R911 Solitary pulmonary nodule: Secondary | ICD-10-CM | POA: Diagnosis present

## 2022-06-04 DIAGNOSIS — D692 Other nonthrombocytopenic purpura: Secondary | ICD-10-CM

## 2022-06-04 DIAGNOSIS — R700 Elevated erythrocyte sedimentation rate: Secondary | ICD-10-CM

## 2022-06-04 NOTE — Patient Instructions (Signed)
Standing Labs We placed an order today for your standing lab work.   Please have your standing labs drawn in November  If possible, please have your labs drawn 2 weeks prior to your appointment so that the provider can discuss your results at your appointment.  Please note that you may see your imaging and lab results in [facility] before we have reviewed them. We may be awaiting multiple results to interpret others before contacting you. Please allow our office up to 72 hours to thoroughly review all of the results before contacting the office for clarification of your results.  We have open lab daily: Monday through Thursday from 1:30 PM-5:00 PM and Friday from 8:30 AM-12:00 PM at the office of Dr. Bo Merino, [facility].   Please be advised, all patients with office appointments requiring lab work will take precedent over walk-in lab work.  If possible, please come for your lab work on Monday and Thursday afternoons, as you may experience shorter wait times. The office is located at [address], [place], [place], [place] [address] No appointment is necessary.   Labs are drawn by [facility]. Please bring your co-pay at the time of your lab draw.  You may receive a bill from [facility] for your lab work.  Please note if you are on Hydroxychloroquine and and an order has been placed for a Hydroxychloroquine level, you will need to have it drawn 4 hours or more after your last dose.  If you wish to have your labs drawn at another location, please call the office 24 hours in advance to send orders.  If you have any questions regarding directions or hours of operation,  please call [phone].   As a reminder, please drink plenty of water prior to coming for your lab work. Thanks!   Vaccines You are taking a medication(s) that can suppress your immune system.  The following immunizations are recommended: Flu annually Covid-19  Td/Tdap (tetanus, diphtheria, pertussis) every  10 years Pneumonia (Prevnar 15 then Pneumovax 23 at least 1 year apart.  Alternatively, can take Prevnar 20 without needing additional dose) Shingrix: 2 doses from 4 weeks to 6 months apart  Please check with your PCP to make sure you are up to date.

## 2022-07-21 ENCOUNTER — Other Ambulatory Visit: Payer: Self-pay | Admitting: Physician Assistant

## 2022-07-22 NOTE — Telephone Encounter (Signed)
Next Visit: 10/03/2022  Last Visit: 06/04/2022  Labs: 6/19/023 Sodium 127, Chloride 93, Glucose 109, Total Protein 9.7, RBC 3.38, Hemoglobin 11.2, HCT 32.4,   Eye exam: 11/20/2021   Current Dose per office note 06/04/2022: Plaquenil 200 mg twice daily Monday through Friday.   OA:[id number] syndrome with other organ involvement   Last Fill: 05/01/2022  Okay to refill Plaquenil?

## 2022-09-16 ENCOUNTER — Ambulatory Visit
Admission: RE | Admit: 2022-09-16 | Discharge: 2022-09-16 | Disposition: A | Discharge status: Home / Self Care | Payer: Medicare Other | Source: Ambulatory Visit | Attending: Internal Medicine | Admitting: Internal Medicine

## 2022-09-16 DIAGNOSIS — M810 Age-related osteoporosis without current pathological fracture: Secondary | ICD-10-CM

## 2022-09-19 ENCOUNTER — Other Ambulatory Visit: Payer: Self-pay | Admitting: *Deleted

## 2022-09-19 DIAGNOSIS — M3509 Sicca syndrome with other organ involvement: Secondary | ICD-10-CM

## 2022-09-19 DIAGNOSIS — Z79899 Other long term (current) drug therapy: Secondary | ICD-10-CM

## 2022-09-20 LAB — URINALYSIS, ROUTINE W REFLEX MICROSCOPIC
Bilirubin Urine: NEGATIVE
Glucose, UA: NEGATIVE
Hgb urine dipstick: NEGATIVE
Ketones, ur: NEGATIVE
Leukocytes,Ua: NEGATIVE
Nitrite: NEGATIVE
Protein, ur: NEGATIVE
Specific Gravity, Urine: 1.008 (ref 1.001–1.035)
pH: 7 (ref 5.0–8.0)

## 2022-09-20 LAB — CBC WITH DIFFERENTIAL/PLATELET
Absolute Monocytes: 422 cells/uL (ref 200–950)
Basophils Absolute: 48 cells/uL (ref 0–200)
Basophils Relative: 1.1 %
Eosinophils Absolute: 79 cells/uL (ref 15–500)
Eosinophils Relative: 1.8 %
HCT: 34.9 % — ABNORMAL LOW (ref 35.0–45.0)
Hemoglobin: 12 g/dL (ref 11.7–15.5)
Lymphs Abs: 752 cells/uL — ABNORMAL LOW (ref 850–3900)
MCH: 33.6 pg — ABNORMAL HIGH (ref 27.0–33.0)
MCHC: 34.4 g/dL (ref 32.0–36.0)
MCV: 97.8 fL (ref 80.0–100.0)
MPV: 8.6 fL (ref 7.5–12.5)
Monocytes Relative: 9.6 %
Neutro Abs: 3098 cells/uL (ref 1500–7800)
Neutrophils Relative %: 70.4 %
Platelets: 274 10*3/uL (ref 140–400)
RBC: 3.57 10*6/uL — ABNORMAL LOW (ref 3.80–5.10)
RDW: 11.2 % (ref 11.0–15.0)
Total Lymphocyte: 17.1 %
WBC: 4.4 10*3/uL (ref 3.8–10.8)

## 2022-09-20 LAB — COMPLETE METABOLIC PANEL WITH GFR
AG Ratio: 0.8 (calc) — ABNORMAL LOW (ref 1.0–2.5)
ALT: 16 U/L (ref 6–29)
AST: 25 U/L (ref 10–35)
Albumin: 4 g/dL (ref 3.6–5.1)
Alkaline phosphatase (APISO): 56 U/L (ref 37–153)
BUN: 15 mg/dL (ref 7–25)
CO2: 26 mmol/L (ref 20–32)
Calcium: 9.6 mg/dL (ref 8.6–10.4)
Chloride: 94 mmol/L — ABNORMAL LOW (ref 98–110)
Creat: 0.78 mg/dL (ref 0.60–1.00)
Globulin: 5.1 g/dL (calc) — ABNORMAL HIGH (ref 1.9–3.7)
Glucose, Bld: 97 mg/dL (ref 65–99)
Potassium: 4.5 mmol/L (ref 3.5–5.3)
Sodium: 129 mmol/L — ABNORMAL LOW (ref 135–146)
Total Bilirubin: 0.6 mg/dL (ref 0.2–1.2)
Total Protein: 9.1 g/dL — ABNORMAL HIGH (ref 6.1–8.1)
eGFR: 79 mL/min/{1.73_m2} (ref >=60)

## 2022-09-20 LAB — C3 AND C4
C3 Complement: 116 mg/dL (ref 83–193)
C4 Complement: 25 mg/dL (ref 15–57)

## 2022-09-20 LAB — SJOGRENS SYNDROME-A EXTRACTABLE NUCLEAR ANTIBODY: SSA (Ro) (ENA) Antibody, IgG: >8 AI — AB

## 2022-09-20 LAB — RHEUMATOID FACTOR: Rheumatoid fact SerPl-aCnc: 188 IU/mL — ABNORMAL HIGH (ref <14)

## 2022-09-20 NOTE — Progress Notes (Unsigned)
Office Visit Note  Patient: April Howell             Date of Birth: June 07, 1946           MRN: [id number]             PCP: Riesa Pope, MD Referring: Riesa Pope, * Visit Date: 10/03/2022 Occupation: _0 @  Subjective:  Sicca symptoms  History of Present Illness: April Howell is a 76 y.o. female with history of sjogren's syndrome.  She is taking plaquenil 200 mg 1 tablet by mouth twice daily Monday through Friday.  She continues to tolerate Plaquenil without any side effects.  She has not missed any doses recently.  She continues to have chronic sicca symptoms which have been tolerable overall.  She has been using eyedrops as well as xylitol products for symptomatic relief.  She has been seeing the dentist every 6 months and has not had any recent dental caries.  She denies any parotid pain or swelling.  She denies any cervical lymphadenopathy.  She denies any sores in her mouth or nose.  She has not had any increased joint pain or joint swelling.  Her energy level has been stable and has improved overall since initiating Plaquenil.  She has been sleeping well at night overall.      Activities of Daily Living:  Patient reports morning stiffness for 0 minutes.   Patient Reports nocturnal pain.  Difficulty dressing/grooming: Denies Difficulty climbing stairs: Denies Difficulty getting out of chair: Denies Difficulty using hands for taps, buttons, cutlery, and/or writing: Denies  Review of Systems  Constitutional:  Positive for fatigue.  HENT:  Positive for mouth dryness. Negative for mouth sores and nose dryness.   Eyes:  Positive for dryness. Negative for pain and visual disturbance.  Respiratory:  Negative for cough, hemoptysis and difficulty breathing.   Cardiovascular:  Negative for chest pain, palpitations, hypertension and swelling in legs/feet.  Gastrointestinal:  Negative for blood in stool, constipation and diarrhea.  Endocrine: Negative for  increased urination.  Genitourinary:  Negative for painful urination and involuntary urination.  Musculoskeletal:  Positive for myalgias, muscle tenderness and myalgias. Negative for joint pain, gait problem, joint pain, joint swelling, muscle weakness and morning stiffness.  Skin:  Positive for color change, hair loss and sensitivity to sunlight. Negative for pallor, rash, nodules/bumps, skin tightness and ulcers.  Allergic/Immunologic: Negative for susceptible to infections.  Neurological:  Negative for dizziness, numbness, headaches and weakness.  Hematological:  Negative for swollen glands.  Psychiatric/Behavioral:  Negative for depressed mood and sleep disturbance. The patient is not nervous/anxious.     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 06/15/2021   Nodule of upper lobe of right lung 06/15/2021   Purpura ([place]) 06/14/2021   Chronic fatigue 06/14/2021   Primary hypertension 06/14/2021   Cough productive of purulent sputum 06/14/2021   Abdominal bruit 06/14/2021   Elevated total protein 06/14/2021    Past Medical History:  Diagnosis Date   Anemia    Basal cell carcinoma    Breast cancer ([place])    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   Osteopenia    Sjogren's syndrome (HCC)    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     Family History  Problem Relation Age of  Onset   Multiple sclerosis Mother    Heart attack Father    Heart disease Father    Hodgkin's lymphoma Sister    Breast cancer Sister 68   Basal cell carcinoma Sister    Healthy Son    Past Surgical History:  Procedure Laterality Date   CATARACT EXTRACTION, BILATERAL Bilateral    05/06/2022 and 05/27/2022   MASTECTOMY Right    Social History   Social History Narrative   Not on file    Immunization History  Administered Date(s) Administered   PFIZER(Purple Top)SARS-COV-2 Vaccination 11/12/2019, 12/03/2019, 07/19/2020, 02/14/2021     Objective: Vital Signs: BP 134/71 (BP Location: Left Arm, Patient Position: Sitting, Cuff Size: Normal)   Pulse 81   Resp 13   Ht _0  (1.753 m)   Wt 133 lb (60.3 kg)   BMI 19.64 kg/m    Physical Exam Vitals and nursing note reviewed.  Constitutional:      Appearance: She is well-developed.  HENT:     Head: Normocephalic and atraumatic.  Eyes:     Conjunctiva/sclera: Conjunctivae normal.  Cardiovascular:     Rate and Rhythm: Normal rate and regular rhythm.     Heart sounds: Normal heart sounds.  Pulmonary:     Effort: Pulmonary effort is normal.     Breath sounds: Normal breath sounds.  Abdominal:     General: Bowel sounds are normal.     Palpations: Abdomen is soft.  Musculoskeletal:     Cervical back: Normal range of motion.  Skin:    General: Skin is warm and dry.     Capillary Refill: Capillary refill takes less than 2 seconds.  Neurological:     Mental Status: She is alert and oriented to person, place, and time.  Psychiatric:        Behavior: Behavior normal.      Musculoskeletal Exam: C-spine has good range of motion with no discomfort.  Shoulder joints, elbow joints, wrist joints, MCPs, PIPs, DIPs have good range of motion with no synovitis.  Complete fist formation bilaterally.  Hip joints have good range of motion with no groin pain.  Knee joints have good range of motion with no warmth or effusion.  Ankle joints have good range of motion with no tenderness or synovitis.  CDAI Exam: CDAI Score: -- Patient Global: --; Provider Global: -- Swollen: --; Tender: -- Joint Exam 10/03/2022   No joint exam has been documented for this visit   There is currently no information documented on the homunculus. Go to the Rheumatology activity and complete the homunculus joint exam.  Investigation: No additional  findings.  Imaging: DG BONE DENSITY (DXA)  Result Date: 09/16/2022 EXAM: DUAL X-RAY ABSORPTIOMETRY (DXA) FOR BONE MINERAL DENSITY IMPRESSION: Referring Physician:  Riesa Pope Your patient completed a bone mineral density test using GE Lunar iDXA system (analysis version: 16). Technologist: sec PATIENT: Name: Mecca, Guitron Patient ID: [id number] Birth Date: 1946/05/25 Height: 69.0 in. Sex: Female Measured: 09/16/2022 Weight: 128.0 lbs. Indications: Advanced Age, Estrogen Deficient, Postmenopausal Fractures: NONE Treatments: Calcium (E943.0), Vitamin D (E933.5) ASSESSMENT: The BMD measured at Forearm Radius 33% is 0.469 g/cm2 with a T-score of -4.6. This patient is considered osteoporotic according to [facility]) criteria. The quality of the exam is good. Site Region Measured Date Measured Age YA BMD Significant CHANGE T-score Left Forearm Radius 33% 09/16/2022 75.0 -4.6 0.469 g/cm2 AP Spine L1-L4 09/16/2022 75.0 -2.5 0.879 g/cm2 DualFemur Total Right 09/16/2022 75.0 -3.3 0.594 g/cm2 DualFemur  Total Mean 09/16/2022 75.0 -3.2 0.603 g/cm2 World Health Organization [facility]) criteria for post-menopausal, Caucasian Women: Normal       T-score at or above -1 SD Osteopenia   T-score between -1 and -2.5 SD Osteoporosis T-score at or below -2.5 SD RECOMMENDATION: 1. All patients should optimize calcium and vitamin D intake. 2. Consider FDA-approved medical therapies in postmenopausal women and men aged 55 years and older, based on the following: a. A hip or vertebral (clinical or morphometric) fracture. b. T-score = -2.5 at the femoral neck or spine after appropriate evaluation to exclude secondary causes. c. Low bone mass (T-score between -1.0 and -2.5 at the femoral neck or spine) and a 10-year probability of a hip fracture = 3% or a 10-year probability of a major osteoporosis-related fracture = 20% based on the [place]-adapted WHO algorithm. d. Clinician judgment and/or patient preferences may  indicate treatment for people with 10-year fracture probabilities above or below these levels. FOLLOW-UP: Patients with diagnosis of osteoporosis or at high risk for fracture should have regular bone mineral density tests.? Patients eligible for Medicare are allowed routine testing every 2 years.? The testing frequency can be increased to one year for patients who have rapidly progressing disease, are receiving or discontinuing medical therapy to restore bone mass, or have additional risk factors. I have reviewed this study and agree with the findings. [facility] Electronically Signed   By: Zerita Boers M.D.   On: 09/16/2022 14:17    Recent Labs: Lab Results  Component Value Date   WBC 4.4 09/19/2022   HGB 12.0 09/19/2022   PLT 274 09/19/2022   NA 129 (L) 09/19/2022   K 4.5 09/19/2022   CL 94 (L) 09/19/2022   CO2 26 09/19/2022   GLUCOSE 97 09/19/2022   BUN 15 09/19/2022   CREATININE 0.78 09/19/2022   BILITOT 0.6 09/19/2022   ALKPHOS 46 04/08/2022   AST 25 09/19/2022   ALT 16 09/19/2022   PROT 9.1 (H) 09/19/2022   ALBUMIN 3.8 04/08/2022   CALCIUM 9.6 09/19/2022   QFTBGOLDPLUS NEGATIVE 10/10/2021    Speciality Comments: PLQ Eye Exam: 11/20/2021 WNL @ [place] Follow up in 1 year PLQ started 02/02/20223  Procedures:  No procedures performed Allergies: Patient has no known allergies.        Assessment / Plan:     Visit Diagnoses: Sjogren's syndrome with other organ involvement ([place]) - Positive ANA, positive Ro, positive La, positive RF, sicca symptoms (dry mouth, dry eyes, dry skin, vaginal dryness): She continues to have chronic sicca symptoms.  Her symptoms have been tolerable with the use of xylitol products and eyedrops for symptomatic relief.  She has not had any parotid swelling or tenderness.  No cervical lymphadenopathy.  No oral or nasal ulcerations.  She has been seeing the dentist every 6 months as advised.  Overall she has clinically been doing  well taking Plaquenil 200 mg 1 tablet by mouth twice daily Monday through Friday.  She is tolerating Plaquenil without any side effects.  She has no signs of inflammatory arthritis at this time.  No synovitis noted.  Discussed the 4-14 fold increased risk for developing lymphoma in patients with Sjogren's syndrome.  Future order for SPEP, ESR, CBC with differential, and RF were placed today. She is not experiencing any new or worsening pulmonary symptoms.  Her lungs were clear to auscultation today.  Discussed the increased risk for developing ILD in patients with Sjogren's syndrome. She will remain on Plaquenil  as prescribed.  The following labs will be drawn prior to her next appointment.  She will follow-up in the office in 5 months or sooner if needed.- Plan: COMPLETE METABOLIC PANEL WITH GFR, CBC with Differential/Platelet, Urinalysis, Routine w reflex microscopic, ANA, C3 and C4, Sedimentation rate, Rheumatoid factor, Serum protein electrophoresis with reflex  High risk medication use -Plaquenil 200 mg 1 tablet by mouth twice daily Monday through Friday.   PLQ Eye Exam: 11/20/2021 WNL @ [place] Follow up in 1 year.  She was given a Plaquenil eye examination form to take with her to her upcoming appointment in January 2024. CBC and CMP updated on 09/19/22.  Future orders will be placed today. She denies any new medical conditions. She has not had any recent or recurrent infections.  - Plan: COMPLETE METABOLIC PANEL WITH GFR, CBC with Differential/Platelet  Elevated sed rate - Future order for sed rate placed today. Plan: Sedimentation rate  Chronic fatigue: Stable. Improved with use of plaquenil.   Age-related osteoporosis without current pathological fracture  - Previously treated with bisphosphonates.  DEXA updated on 09/16/22: The BMD measured at Forearm Radius 33% is 0.469 g/cm2 with a T-score of -4.6.  No previous DEXA results to compare to.  Patient plans on following up with  her PCP to discuss results and the treatment options.  She is taking a calcium supplement daily.  Other medical conditions are listed as follows:   Primary hypertension: BP was 134/71 today in the office.   Bronchiectasis without complication ([place]) - Followed by Dr. Erin Fulling  Nodule of upper lobe of right lung  Senile purpura ([place])  Abnormal SPEP: Future order for SPEP placed today.   Other iron deficiency anemia  History of breast cancer  Family history of multiple sclerosis  Purpura ([place])  Orders: Orders Placed This Encounter  Procedures   COMPLETE METABOLIC PANEL WITH GFR   CBC with Differential/Platelet   Urinalysis, Routine w reflex microscopic   ANA   C3 and C4   Sedimentation rate   Rheumatoid factor   Serum protein electrophoresis with reflex   No orders of the defined types were placed in this encounter.    Follow-Up Instructions: Return in about 5 months (around 03/04/2023) for Sjogren's syndrome.   Ofilia Neas, PA-C  Note - This record has been created using Dragon software.  Chart creation errors have been sought, but may not always  have been located. Such creation errors do not reflect on  the standard of medical care.

## 2022-10-03 ENCOUNTER — Encounter: Payer: Self-pay | Admitting: Student

## 2022-10-03 ENCOUNTER — Encounter: Payer: Self-pay | Admitting: Physician Assistant

## 2022-10-03 ENCOUNTER — Ambulatory Visit: Payer: Medicare Other | Attending: Physician Assistant | Admitting: Physician Assistant

## 2022-10-03 VITALS — BP 134/71 | HR 81 | Resp 13 | Ht 69.0 in | Wt 133.0 lb

## 2022-10-03 DIAGNOSIS — R911 Solitary pulmonary nodule: Secondary | ICD-10-CM | POA: Insufficient documentation

## 2022-10-03 DIAGNOSIS — D508 Other iron deficiency anemias: Secondary | ICD-10-CM | POA: Diagnosis present

## 2022-10-03 DIAGNOSIS — Z79899 Other long term (current) drug therapy: Secondary | ICD-10-CM | POA: Diagnosis present

## 2022-10-03 DIAGNOSIS — J479 Bronchiectasis, uncomplicated: Secondary | ICD-10-CM

## 2022-10-03 DIAGNOSIS — M810 Age-related osteoporosis without current pathological fracture: Secondary | ICD-10-CM

## 2022-10-03 DIAGNOSIS — Z820 Family history of epilepsy and other diseases of the nervous system: Secondary | ICD-10-CM

## 2022-10-03 DIAGNOSIS — R5382 Chronic fatigue, unspecified: Secondary | ICD-10-CM | POA: Insufficient documentation

## 2022-10-03 DIAGNOSIS — R778 Other specified abnormalities of plasma proteins: Secondary | ICD-10-CM | POA: Diagnosis present

## 2022-10-03 DIAGNOSIS — I10 Essential (primary) hypertension: Secondary | ICD-10-CM | POA: Diagnosis present

## 2022-10-03 DIAGNOSIS — R700 Elevated erythrocyte sedimentation rate: Secondary | ICD-10-CM | POA: Diagnosis present

## 2022-10-03 DIAGNOSIS — M3509 Sicca syndrome with other organ involvement: Secondary | ICD-10-CM | POA: Diagnosis present

## 2022-10-03 DIAGNOSIS — M8589 Other specified disorders of bone density and structure, multiple sites: Secondary | ICD-10-CM | POA: Diagnosis present

## 2022-10-03 DIAGNOSIS — D692 Other nonthrombocytopenic purpura: Secondary | ICD-10-CM | POA: Insufficient documentation

## 2022-10-03 DIAGNOSIS — Z853 Personal history of malignant neoplasm of breast: Secondary | ICD-10-CM | POA: Diagnosis present

## 2022-10-06 ENCOUNTER — Other Ambulatory Visit: Payer: Self-pay | Admitting: Hematology and Oncology

## 2022-10-06 DIAGNOSIS — D500 Iron deficiency anemia secondary to blood loss (chronic): Secondary | ICD-10-CM

## 2022-10-06 NOTE — Progress Notes (Unsigned)
[place] [phone]   [phone]  PROGRESS NOTE  Patient Care Team: Riesa Pope, MD as PCP - General (Internal Medicine)  Hematological/Oncological History # Lung Nodules  06/14/2021: CXR showed irregular nodular density is noted in right upper lobe during workup for productive cough x 3 months.  06/22/2021: establish care with Dr. Lorenso Courier  07/17/2021: CT scan shows peribronchovascular nodularity and mucoid impaction, likely explaining the concerning 'lung mass'  #Iron Deficiency Anemia/Anemia of Chronic Disease 04/08/2022: WBC 4.0, Hgb 11.2, MCV 97.9, Plt 219  Interval History:  April Howell 76 y.o. female with medical history significant for lung nodules and iron deficiency anemia who presents for a follow up visit. The patient's last visit was on 04/08/2022.   On exam today April Howell reports that she is "getting better all the time".  She notes her energy levels are coming back.  She notes she continues to take iron pills and has been taking 1 pill a day in the morning.  She was taking pilocarpine but it only "made her sweat".  She has been taking Plaquenil and has been working quite well for her.  She has noted that she has not been having any issues with bleeding, bruising, or dark stools.  She also notes she is tolerating iron pills well without any constipation or stomach upset.  She currently ranks her energy as 8 out of 10.  She has upcoming cataract surgery in July and August.  Overall she feels well and has no comments questions or concerns at this time.  Otherwise denies any fevers, chills, sweats, nausea, vomiting or diarrhea.  A full 10 point ROS is listed below.  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     SURGICAL  HISTORY: Past Surgical History:  Procedure Laterality Date   CATARACT EXTRACTION, BILATERAL Bilateral    05/06/2022 and 05/27/2022   MASTECTOMY Right     SOCIAL HISTORY: Social History   Socioeconomic History   Marital status: Married    Spouse name: Not on file   Number of children: Not on file   Years of education: Not on file   Highest education level: Not on file  Occupational History   Not on file  Tobacco Use   Smoking status: Former    Packs/day: 1.00    Years: 4.00    Total pack years: 4.00    Types: Cigarettes    Quit date: 40    Years since quitting: 51.9    Passive exposure: Past   Smokeless tobacco: Never  Vaping Use   Vaping Use: Never used  Substance and Sexual Activity   Alcohol use: Yes    Alcohol/week: 7.0 standard drinks of alcohol    Types: 7 Glasses of wine per week   Drug use: Never   Sexual activity: Yes    Partners: Male  Other Topics Concern   Not on file  Social History Narrative   Not on file   Social Determinants of Health   Financial Resource Strain: Not on file  Food Insecurity: Not on file  Transportation Needs: Not on file  Physical Activity: Not on file  Stress: Not on file  Social Connections: Not on file  Intimate Partner Violence: Not on  file    FAMILY HISTORY: Family History  Problem Relation Age of Onset   Multiple sclerosis Mother    Heart attack Father    Heart disease Father    Hodgkin's lymphoma Sister    Breast cancer Sister 55   Basal cell carcinoma Sister    Healthy Son     ALLERGIES:  has No Known Allergies.  MEDICATIONS:  Current Outpatient Medications  Medication Sig Dispense Refill   CALCIUM PO Take 650 mg by mouth daily.     ferrous sulfate 325 (65 FE) MG EC tablet Take 325 mg by mouth daily with breakfast.     hydroxychloroquine (PLAQUENIL) 200 MG tablet TAKE ONE TABLET BY MOUTH TWICE DAILY ON MONDAY THROUGH FRIDAY 120 tablet 0   Multiple Vitamin (MULTIVITAMIN) tablet Take 1 tablet by mouth  daily.     Omega-3 Fatty Acids (FISH OIL) 1000 MG CAPS Take by mouth.     No current facility-administered medications for this visit.    REVIEW OF SYSTEMS:   Constitutional: ( - ) fevers, ( - )  chills , ( - ) night sweats Eyes: ( - ) blurriness of vision, ( - ) double vision, ( - ) watery eyes Ears, nose, mouth, throat, and face: ( - ) mucositis, ( - ) sore throat Respiratory: ( - ) cough, ( - ) dyspnea, ( - ) wheezes Cardiovascular: ( - ) palpitation, ( - ) chest discomfort, ( - ) lower extremity swelling Gastrointestinal:  ( - ) nausea, ( - ) heartburn, ( - ) change in bowel habits Skin: ( - ) abnormal skin rashes Lymphatics: ( - ) new lymphadenopathy, ( - ) easy bruising Neurological: ( - ) numbness, ( - ) tingling, ( - ) new weaknesses Behavioral/Psych: ( - ) mood change, ( - ) new changes  All other systems were reviewed with the patient and are negative.  PHYSICAL EXAMINATION:  There were no vitals filed for this visit.  There were no vitals filed for this visit.   GENERAL: Well-appearing elderly Caucasian female, alert, no distress and comfortable SKIN: skin color, texture, turgor are normal, no rashes or significant lesions EYES: conjunctiva are pink and non-injected, sclera clear LUNGS: clear to auscultation and percussion with normal breathing effort HEART: regular rate & rhythm and no murmurs and no lower extremity edema Musculoskeletal: no cyanosis of digits and no clubbing  PSYCH: alert & oriented x 3, fluent speech NEURO: no focal motor/sensory deficits  LABORATORY DATA:  I have reviewed the data as listed    Latest Ref Rng & Units 09/19/2022    2:23 PM 04/08/2022    1:53 PM 12/21/2021    1:50 PM  CBC  WBC 3.8 - 10.8 Thousand/uL 4.4  4.0  4.9   Hemoglobin 11.7 - 15.5 g/dL 12.0  11.2  10.6   Hematocrit 35.0 - 45.0 % 34.9  32.4  31.5   Platelets 140 - 400 Thousand/uL 274  219  240        Latest Ref Rng & Units 09/19/2022    2:23 PM 04/08/2022    1:53 PM  12/31/2021    2:30 PM  CMP  Glucose 65 - 99 mg/dL 97  109  105   BUN 7 - 25 mg/dL '15  14  17   '$ Creatinine 0.60 - 1.00 mg/dL 0.78  0.79  0.90   Sodium 135 - 146 mmol/L 129  127  127   Potassium 3.5 - 5.3 mmol/L 4.5  3.8  5.1   Chloride 98 - 110 mmol/L 94  93  91   CO2 20 - 32 mmol/L '26  29  26   '$ Calcium 8.6 - 10.4 mg/dL 9.6  9.6  9.5   Total Protein 6.1 - 8.1 g/dL 9.1  9.7    Total Bilirubin 0.2 - 1.2 mg/dL 0.6  0.5    Alkaline Phos 38 - 126 U/L  46    AST 10 - 35 U/L 25  27    ALT 6 - 29 U/L 16  16      Lab Results  Component Value Date   MPROTEIN Not Observed 10/08/2021   MPROTEIN Not Observed 06/22/2021   Lab Results  Component Value Date   KPAFRELGTCHN 113.8 (H) 10/08/2021   KPAFRELGTCHN 116.0 (H) 06/22/2021   LAMBDASER 63.1 (H) 10/08/2021   LAMBDASER 68.8 (H) 06/22/2021   KAPLAMBRATIO 1.80 (H) 10/08/2021   KAPLAMBRATIO 1.69 (H) 06/22/2021    RADIOGRAPHIC STUDIES: DG BONE DENSITY (DXA)  Result Date: 09/16/2022 EXAM: DUAL X-RAY ABSORPTIOMETRY (DXA) FOR BONE MINERAL DENSITY IMPRESSION: Referring Physician:  Riesa Pope Your patient completed a bone mineral density test using GE Lunar iDXA system (analysis version: 16). Technologist: sec PATIENT: Name: Demetrice, Amstutz Patient ID: [id number] Birth Date: [birth year] Height: 69.0 in. Sex: Female Measured: 09/16/2022 Weight: 128.0 lbs. Indications: Advanced Age, Estrogen Deficient, Postmenopausal Fractures: NONE Treatments: Calcium (E943.0), Vitamin D (E933.5) ASSESSMENT: The BMD measured at Forearm Radius 33% is 0.469 g/cm2 with a T-score of -4.6. This patient is considered osteoporotic according to [facility]) criteria. The quality of the exam is good. Site Region Measured Date Measured Age YA BMD Significant CHANGE T-score Left Forearm Radius 33% 09/16/2022 75.0 -4.6 0.469 g/cm2 AP Spine L1-L4 09/16/2022 76.0 -2.5 0.879 g/cm2 DualFemur Total Right 09/16/2022 75.0 -3.3 0.594 g/cm2 DualFemur Total Mean  09/16/2022 75.0 -3.2 0.603 g/cm2 World Health Organization [facility]) criteria for post-menopausal, Caucasian Women: Normal       T-score at or above -1 SD Osteopenia   T-score between -1 and -2.5 SD Osteoporosis T-score at or below -2.5 SD RECOMMENDATION: 1. All patients should optimize calcium and vitamin D intake. 2. Consider FDA-approved medical therapies in postmenopausal women and men aged 66 years and older, based on the following: a. A hip or vertebral (clinical or morphometric) fracture. b. T-score = -2.5 at the femoral neck or spine after appropriate evaluation to exclude secondary causes. c. Low bone mass (T-score between -1.0 and -2.5 at the femoral neck or spine) and a 10-year probability of a hip fracture = 3% or a 10-year probability of a major osteoporosis-related fracture = 20% based on the [place]-adapted WHO algorithm. d. Clinician judgment and/or patient preferences may indicate treatment for people with 10-year fracture probabilities above or below these levels. FOLLOW-UP: Patients with diagnosis of osteoporosis or at high risk for fracture should have regular bone mineral density tests.? Patients eligible for Medicare are allowed routine testing every 2 years.? The testing frequency can be increased to one year for patients who have rapidly progressing disease, are receiving or discontinuing medical therapy to restore bone mass, or have additional risk factors. I have reviewed this study and agree with the findings. [facility] Electronically Signed   By: Zerita Boers M.D.   On: 09/16/2022 14:17    ASSESSMENT & PLAN Niomie Englert 76 y.o. female with medical history significant for lung nodules and iron deficiency anemia who presents for a follow up visit.  #Normocytic Anemia #Iron deficiency anemia --  labs today show hemoglobin 11.2, MCV 97.9, and platelets of 219.  May be component of anemia of chronic disease/inflammation driving down the hemoglobin. --Ferritin level 65, saturation  30%, serum iron 104 on 10/08/2021.  Iron levels pending from today. --continue ferrous sulfate 325 mg p.o. daily. -- Return to clinic in 6 months time to reevaluate.  #Lung Nodule --findings initially on Xray, CT scan on 07/17/2021 does not show a mass but concern for atypical infection --Patient has established care with pulmonology for evaluation and management of this possible atypical infection. --continue to monitor   #Elevated Serum Protein -- no evidence of a monoclonal protein on initial labs and our workup. --continue to monitor  No orders of the defined types were placed in this encounter.  All questions were answered. The patient knows to call the clinic with any problems, questions or concerns.  A total of more than 30 minutes were spent on this encounter with face-to-face time and non-face-to-face time, including preparing to see the patient, ordering tests and/or medications, counseling the patient and coordination of care as outlined above.   Ledell Peoples, MD Department of Hematology/Oncology [place] [facility] Phone: [phone] Pager: [phone] Email: [email or web address]  10/06/2022 3:59 PM

## 2022-10-07 ENCOUNTER — Other Ambulatory Visit: Payer: Self-pay

## 2022-10-07 ENCOUNTER — Ambulatory Visit: Payer: Medicare Other | Admitting: Hematology and Oncology

## 2022-10-07 ENCOUNTER — Other Ambulatory Visit: Payer: Medicare Other

## 2022-10-07 ENCOUNTER — Inpatient Hospital Stay: Payer: Medicare Other | Attending: Hematology and Oncology

## 2022-10-07 ENCOUNTER — Inpatient Hospital Stay (HOSPITAL_BASED_OUTPATIENT_CLINIC_OR_DEPARTMENT_OTHER): Payer: Medicare Other | Admitting: Hematology and Oncology

## 2022-10-07 VITALS — BP 128/72 | HR 82 | Temp 97.2°F | Resp 16 | Wt 132.4 lb

## 2022-10-07 DIAGNOSIS — Z87891 Personal history of nicotine dependence: Secondary | ICD-10-CM | POA: Diagnosis not present

## 2022-10-07 DIAGNOSIS — Z808 Family history of malignant neoplasm of other organs or systems: Secondary | ICD-10-CM | POA: Insufficient documentation

## 2022-10-07 DIAGNOSIS — R779 Abnormality of plasma protein, unspecified: Secondary | ICD-10-CM | POA: Diagnosis not present

## 2022-10-07 DIAGNOSIS — D509 Iron deficiency anemia, unspecified: Secondary | ICD-10-CM | POA: Insufficient documentation

## 2022-10-07 DIAGNOSIS — R918 Other nonspecific abnormal finding of lung field: Secondary | ICD-10-CM | POA: Diagnosis not present

## 2022-10-07 DIAGNOSIS — D500 Iron deficiency anemia secondary to blood loss (chronic): Secondary | ICD-10-CM

## 2022-10-07 DIAGNOSIS — Z803 Family history of malignant neoplasm of breast: Secondary | ICD-10-CM | POA: Insufficient documentation

## 2022-10-07 DIAGNOSIS — Z807 Family history of other malignant neoplasms of lymphoid, hematopoietic and related tissues: Secondary | ICD-10-CM | POA: Insufficient documentation

## 2022-10-07 DIAGNOSIS — Z85828 Personal history of other malignant neoplasm of skin: Secondary | ICD-10-CM | POA: Diagnosis not present

## 2022-10-07 LAB — CBC WITH DIFFERENTIAL (CANCER CENTER ONLY)
Abs Immature Granulocytes: 0.01 10*3/uL (ref 0.00–0.07)
Basophils Absolute: 0 10*3/uL (ref 0.0–0.1)
Basophils Relative: 1 %
Eosinophils Absolute: 0.1 10*3/uL (ref 0.0–0.5)
Eosinophils Relative: 2 %
HCT: 34.4 % — ABNORMAL LOW (ref 36.0–46.0)
Hemoglobin: 11.8 g/dL — ABNORMAL LOW (ref 12.0–15.0)
Immature Granulocytes: 0 %
Lymphocytes Relative: 17 %
Lymphs Abs: 0.6 10*3/uL — ABNORMAL LOW (ref 0.7–4.0)
MCH: 33.1 pg (ref 26.0–34.0)
MCHC: 34.3 g/dL (ref 30.0–36.0)
MCV: 96.4 fL (ref 80.0–100.0)
Monocytes Absolute: 0.3 10*3/uL (ref 0.1–1.0)
Monocytes Relative: 8 %
Neutro Abs: 2.6 10*3/uL (ref 1.7–7.7)
Neutrophils Relative %: 72 %
Platelet Count: 249 10*3/uL (ref 150–400)
RBC: 3.57 MIL/uL — ABNORMAL LOW (ref 3.87–5.11)
RDW: 12.7 % (ref 11.5–15.5)
WBC Count: 3.7 10*3/uL — ABNORMAL LOW (ref 4.0–10.5)
nRBC: 0 % (ref 0.0–0.2)

## 2022-10-07 LAB — CMP (CANCER CENTER ONLY)
ALT: 18 U/L (ref 0–44)
AST: 27 U/L (ref 15–41)
Albumin: 4.1 g/dL (ref 3.5–5.0)
Alkaline Phosphatase: 55 U/L (ref 38–126)
Anion gap: 5 (ref 5–15)
BUN: 17 mg/dL (ref 8–23)
CO2: 30 mmol/L (ref 22–32)
Calcium: 9.8 mg/dL (ref 8.9–10.3)
Chloride: 92 mmol/L — ABNORMAL LOW (ref 98–111)
Creatinine: 0.82 mg/dL (ref 0.44–1.00)
GFR, Estimated: >60 mL/min (ref >60)
Glucose, Bld: 112 mg/dL — ABNORMAL HIGH (ref 70–99)
Potassium: 4.1 mmol/L (ref 3.5–5.1)
Sodium: 127 mmol/L — ABNORMAL LOW (ref 135–145)
Total Bilirubin: 0.7 mg/dL (ref 0.3–1.2)
Total Protein: 8.7 g/dL — ABNORMAL HIGH (ref 6.5–8.1)

## 2022-10-07 LAB — RETIC PANEL
Immature Retic Fract: 7 % (ref 2.3–15.9)
RBC.: 3.55 MIL/uL — ABNORMAL LOW (ref 3.87–5.11)
Retic Count, Absolute: 50.1 10*3/uL (ref 19.0–186.0)
Retic Ct Pct: 1.4 % (ref 0.4–3.1)
Reticulocyte Hemoglobin: 36.3 pg (ref >27.9)

## 2022-10-07 LAB — FERRITIN: Ferritin: 56 ng/mL (ref 11–307)

## 2022-10-07 LAB — IRON AND IRON BINDING CAPACITY (CC-WL,HP ONLY)
Iron: 127 ug/dL (ref 28–170)
Saturation Ratios: 32 % — ABNORMAL HIGH (ref 10.4–31.8)
TIBC: 403 ug/dL (ref 250–450)
UIBC: 276 ug/dL (ref 148–442)

## 2022-10-09 ENCOUNTER — Other Ambulatory Visit: Payer: Self-pay | Admitting: Student

## 2022-10-09 DIAGNOSIS — M818 Other osteoporosis without current pathological fracture: Secondary | ICD-10-CM

## 2022-10-16 ENCOUNTER — Other Ambulatory Visit: Payer: Medicare Other

## 2022-10-16 DIAGNOSIS — M818 Other osteoporosis without current pathological fracture: Secondary | ICD-10-CM

## 2022-10-17 LAB — VITAMIN D 25 HYDROXY (VIT D DEFICIENCY, FRACTURES): Vit D, 25-Hydroxy: 58.9 ng/mL (ref 30.0–100.0)

## 2022-10-20 ENCOUNTER — Other Ambulatory Visit: Payer: Self-pay | Admitting: Physician Assistant

## 2022-10-22 NOTE — Telephone Encounter (Signed)
Next Visit: 03/19/2023  Last Visit: 10/03/2022  Labs: 10/07/2022 WBC 3.7, RBC 3.57, Hgb 11.8, Hct 34.4, Lymphs Abs. 0.6, Sodium 127, Chloride 92, Glucose 112, Total Protein 8.7  Eye exam: 11/20/2021 WNL    Current Dose per office note 10/03/2022: Plaquenil 200 mg 1 tablet by mouth twice daily Monday through Friday.   WE:[id number] syndrome with other organ involvement   Last Fill: 07/22/2022  Okay to refill Plaquenil?

## 2022-10-24 ENCOUNTER — Encounter: Payer: Self-pay | Admitting: Student

## 2022-10-30 ENCOUNTER — Other Ambulatory Visit: Payer: Self-pay | Admitting: Student

## 2022-11-05 NOTE — Progress Notes (Signed)
Discussion had with April Howell in regards to dexa scan results consistent with osteoporosis. She did well with 5 years of bisphosphate therapy and had completed a 5 year holiday. After repeat dexa scan conversation had with her about reinitiating bisphosphate therapy. We discussed the risk and benefits of starting these medications and at this time the decision was made to defer therapy.

## 2023-01-12 ENCOUNTER — Other Ambulatory Visit: Payer: Self-pay | Admitting: Rheumatology

## 2023-01-14 NOTE — Telephone Encounter (Signed)
Last Fill: 10/22/2022  Eye exam: 01/01/2023   Labs: 10/07/2022 10/07/2022 WBC 3.7, RBC 3.57, Hgb 11.8, Hct 34.4, Lymphs Abs. 0.6, Sodium 127, Chloride 92, Glucose 112, Total Protein 8.7   Next Visit: 03/19/2023  Last Visit: 10/03/2022  [id number] syndrome with other organ involvement   Current Dose per office note 10/03/2022: -Plaquenil 200 mg 1 tablet by mouth twice daily Monday through Friday.   Okay to refill Plaquenil?

## 2023-01-31 ENCOUNTER — Other Ambulatory Visit: Payer: Self-pay | Admitting: Student

## 2023-01-31 DIAGNOSIS — Z1231 Encounter for screening mammogram for malignant neoplasm of breast: Secondary | ICD-10-CM

## 2023-03-05 NOTE — Progress Notes (Signed)
Office Visit Note  Patient: April Howell             Date of Birth: Mar 12, 1946           MRN: [id number]             PCP: Belva Agee, MD Referring: Belva Agee, * Visit Date: 03/19/2023 Occupation: @GUAROCC @  Subjective:  Left thumb pain  History of Present Illness: April Howell is a 77 y.o. female with history of Sjogren's and osteoporosis.  She continues to have dry mouth and dry eye symptoms.  She also complains of photophobia due to dry eyes.  She has generalized muscle tenderness.  She has been taking hydroxychloroquine 200 mg p.o. twice daily Monday through Friday without any interruption.  She has been tolerating medications well.  She has been using over-the-counter products for dry mouth and dry eye symptoms.  She has been having dry hacking cough.  She has not seen Dr. Francine Graven since October 2022.  She has been experiencing some discomfort in her left thumb base which she relates to holding her phone.    Activities of Daily Living:  Patient reports morning stiffness for 0 minutes.   Patient Reports nocturnal pain.  Difficulty dressing/grooming: Denies Difficulty climbing stairs: Denies Difficulty getting out of chair: Denies Difficulty using hands for taps, buttons, cutlery, and/or writing: Denies  Review of Systems  Constitutional:  Positive for fatigue.  HENT:  Positive for mouth dryness. Negative for mouth sores.   Eyes:  Positive for photophobia and dryness.  Respiratory:  Negative for shortness of breath.   Cardiovascular:  Negative for chest pain and palpitations.  Gastrointestinal:  Negative for blood in stool, constipation and diarrhea.  Endocrine: Negative for increased urination.  Genitourinary:  Negative for involuntary urination.  Musculoskeletal:  Positive for myalgias, muscle weakness, muscle tenderness and myalgias. Negative for joint pain, gait problem, joint pain, joint swelling and morning stiffness.  Skin:  Positive for  sensitivity to sunlight. Negative for color change, rash and hair loss.  Allergic/Immunologic: Negative for susceptible to infections.  Neurological:  Negative for dizziness and headaches.  Hematological:  Negative for swollen glands.  Psychiatric/Behavioral:  Negative for depressed mood and sleep disturbance. The patient is not nervous/anxious.     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 06/15/2021   Nodule of upper lobe of right lung 06/15/2021   Purpura (HCC) 06/14/2021   Chronic fatigue 06/14/2021   Primary hypertension 06/14/2021   Cough productive of purulent sputum 06/14/2021   Abdominal bruit 06/14/2021   Elevated total protein 06/14/2021    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     Family History  Problem Relation Age of Onset   Multiple sclerosis Mother    Heart attack Father    Heart disease Father    Hodgkin's lymphoma Sister    Breast cancer Sister 69   Basal cell carcinoma Sister    Healthy Son    Past Surgical History:  Procedure Laterality Date   CATARACT EXTRACTION, BILATERAL Bilateral  05/06/2022 and 05/27/2022   MASTECTOMY Right    Social History   Social History Narrative   Not on file   Immunization History  Administered Date(s) Administered   PFIZER(Purple Top)SARS-COV-2 Vaccination 11/12/2019, 12/03/2019, 07/19/2020, 02/14/2021     Objective: Vital Signs: BP 128/73 (BP Location: Left Arm, Patient Position: Sitting, Cuff Size: Normal)   Pulse 74   Resp 13   Ht 5\' 9"  (1.753 m)   Wt 130 lb 3.2 oz (59.1 kg)   BMI 19.23 kg/m    Physical  Exam Vitals and nursing note reviewed.  Constitutional:      Appearance: She is well-developed.  HENT:     Head: Normocephalic and atraumatic.  Eyes:     Conjunctiva/sclera: Conjunctivae normal.  Cardiovascular:     Rate and Rhythm: Normal rate and regular rhythm.     Heart sounds: Normal heart sounds.  Pulmonary:     Effort: Pulmonary effort is normal.     Breath sounds: Normal breath sounds.  Abdominal:     General: Bowel sounds are normal.     Palpations: Abdomen is soft.  Musculoskeletal:     Cervical back: Normal range of motion.  Lymphadenopathy:     Cervical: No cervical adenopathy.  Skin:    General: Skin is warm and dry.     Capillary Refill: Capillary refill takes less than 2 seconds.  Neurological:     Mental Status: She is alert and oriented to person, place, and time.  Psychiatric:        Behavior: Behavior normal.      Musculoskeletal Exam: She had good range of motion of cervical, thoracic and lumbar spine.  Shoulders, elbows, wrist joints, MCPs PIPs and DIPs with good range of motion.  She had mild tenderness over left CMC joint.  No synovitis was noted.  Hip joints and knee joints were in good range of motion.  She had no tenderness over ankles or MTPs.  CDAI Exam: CDAI Score: -- Patient Global: --; Provider Global: -- Swollen: --; Tender: -- Joint Exam 03/19/2023   No joint exam has been documented for this visit   There is currently no information documented on the homunculus. Go to the Rheumatology activity and complete the homunculus joint exam.  Investigation: No additional findings.  Imaging: MM 3D SCREENING MAMMOGRAM UNILATERAL LEFT BREAST  Result Date: 03/14/2023 CLINICAL DATA:  Screening. EXAM: DIGITAL SCREENING UNILATERAL LEFT MAMMOGRAM WITH CAD AND TOMOSYNTHESIS TECHNIQUE: Left screening digital craniocaudal and mediolateral oblique mammograms were obtained. Left screening digital breast tomosynthesis was performed. The images were  evaluated with computer-aided detection. COMPARISON:  Previous exam(s). ACR Breast Density Category c: The breasts are heterogeneously dense, which may obscure small masses. FINDINGS: The patient has had a right mastectomy. There are no findings suspicious for malignancy. IMPRESSION: No mammographic evidence of malignancy. A result letter of this screening mammogram will be mailed directly to the patient. RECOMMENDATION: Screening mammogram in one year.  (Code:SM-L-33M) BI-RADS CATEGORY  1: Negative. Electronically Signed   By: Frederico Hamman M.D.   On: 03/14/2023 14:54    Recent Labs: Lab Results  Component Value Date   WBC 3.3 (L) 03/06/2023   HGB 12.2 03/06/2023   PLT 259 03/06/2023   NA 128 (L) 03/06/2023   K 4.5 03/06/2023   CL 91 (L) 03/06/2023   CO2 30 03/06/2023   GLUCOSE 96 03/06/2023   BUN 13 03/06/2023   CREATININE 0.72 03/06/2023   BILITOT 0.7 03/06/2023   ALKPHOS 55 10/07/2022  AST 25 03/06/2023   ALT 17 03/06/2023   PROT 8.9 (H) 03/06/2023   PROT 8.5 (H) 03/06/2023   ALBUMIN 4.1 10/07/2022   CALCIUM 9.6 03/06/2023   QFTBGOLDPLUS NEGATIVE 10/10/2021    Speciality Comments: PLQ Eye Exam: 01/01/2023 WNL @ [facility] Follow up in 1 year PLQ started 02/02/20223  Procedures:  No procedures performed Allergies: Patient has no known allergies.   Assessment / Plan:     Visit Diagnoses: Sjogren's syndrome with other organ involvement (HCC) - Positive ANA, positive Ro, positive La, positive RF, sicca symptoms (dry mouth, dry eyes, dry skin, vaginal dryness): She continues to have dry mouth and dry eye symptoms.  Over-the-counter products were discussed at length.  She declined pilocarpine.  She has been taking hydroxychloroquine 200 mg p.o. twice daily Monday to Friday without any interruption.  Labs obtained on Mar 06, 2023 were reviewed.  White cell count was low at 3.3.  Sodium was low at 128 and chloride low at 91.  Labs have been stable.  Her UA showed 2+  protein.  Will check urine protein creatinine ratio today.  SPEP has been normal.  Complements were normal.  ANA was 1: 1280 nuclear speckled.  Rheumatoid factor continues to be positive at 127.  Sed rate was elevated at 65.  If her urine protein creatinine ratio is elevated will refer to nephrology.  Lab results were discussed with the patient at length.  High risk medication use - Plaquenil 200 mg 1 tablet by mouth twice daily Monday through Friday. PLQ Eye Exam: 01/01/2023.  Elevated sed rate-she is chronically patient of sedimentation rate most likely due to bronchiectasis.  Chronic fatigue-she continues to chronic fatigue.  Need for regular exercise was emphasized.  Age-related osteoporosis without current pathological fracture - DEXA updated on 09/16/22: The BMD measured at Forearm Radius 33% is 0.469 g/cm2 with a T-scoreof -4.6.  I did detailed discussion with the patient regarding treatment options.  She is concerned about the side effects of both medications.  She will let us know when she reviews all the side effects.  Primary hypertension-blood pressure was normal at 128/73 today.  Bronchiectasis without complication (HCC) - Followed by Dr. Francine Graven.  Patient has not seen Dr. Georgian Co since 2022.  She continues to have chronic cough and some sputum.  I advised her to schedule a follow-up appointment with Dr. Daleen Squibb.  Senile purpura (HCC)  Nodule of upper lobe of right lung  Abnormal SPEP-SPEP is normal now.  Other iron deficiency anemia  History of breast cancer  Purpura (HCC)  Family history of multiple sclerosis  Other proteinuria - Plan: Protein / creatinine ratio, urine.  If protein creatinine ratio is elevated we may refer to nephrology.  Orders: Orders Placed This Encounter  Procedures   Protein / creatinine ratio, urine   No orders of the defined types were placed in this encounter.    Follow-Up Instructions: Return in about 5 months (around 08/19/2023) for  Sjogren's, Osteoporosis.   Pollyann Savoy, MD  Note - This record has been created using Animal nutritionist.  Chart creation errors have been sought, but may not always  have been located. Such creation errors do not reflect on  the standard of medical care.

## 2023-03-06 ENCOUNTER — Other Ambulatory Visit: Payer: Self-pay

## 2023-03-06 DIAGNOSIS — R700 Elevated erythrocyte sedimentation rate: Secondary | ICD-10-CM

## 2023-03-06 DIAGNOSIS — M3509 Sicca syndrome with other organ involvement: Secondary | ICD-10-CM

## 2023-03-06 DIAGNOSIS — Z79899 Other long term (current) drug therapy: Secondary | ICD-10-CM

## 2023-03-08 LAB — ANTI-NUCLEAR AB-TITER (ANA TITER): ANA Titer 1: 1:1280 {titer} — ABNORMAL HIGH

## 2023-03-10 LAB — URINALYSIS, ROUTINE W REFLEX MICROSCOPIC
Bacteria, UA: NONE SEEN /HPF
Bilirubin Urine: NEGATIVE
Glucose, UA: NEGATIVE
Hgb urine dipstick: NEGATIVE
Hyaline Cast: NONE SEEN /LPF
Ketones, ur: NEGATIVE
Nitrite: NEGATIVE
RBC / HPF: NONE SEEN /HPF (ref 0–2)
Specific Gravity, Urine: 1.016 (ref 1.001–1.035)
Squamous Epithelial / HPF: NONE SEEN /HPF (ref <=5)
pH: 6.5 (ref 5.0–8.0)

## 2023-03-10 LAB — COMPLETE METABOLIC PANEL WITH GFR
AG Ratio: 0.9 (calc) — ABNORMAL LOW (ref 1.0–2.5)
ALT: 17 U/L (ref 6–29)
AST: 25 U/L (ref 10–35)
Albumin: 4.3 g/dL (ref 3.6–5.1)
Alkaline phosphatase (APISO): 55 U/L (ref 37–153)
BUN: 13 mg/dL (ref 7–25)
CO2: 30 mmol/L (ref 20–32)
Calcium: 9.6 mg/dL (ref 8.6–10.4)
Chloride: 91 mmol/L — ABNORMAL LOW (ref 98–110)
Creat: 0.72 mg/dL (ref 0.60–1.00)
Globulin: 4.6 g/dL (calc) — ABNORMAL HIGH (ref 1.9–3.7)
Glucose, Bld: 96 mg/dL (ref 65–99)
Potassium: 4.5 mmol/L (ref 3.5–5.3)
Sodium: 128 mmol/L — ABNORMAL LOW (ref 135–146)
Total Bilirubin: 0.7 mg/dL (ref 0.2–1.2)
Total Protein: 8.9 g/dL — ABNORMAL HIGH (ref 6.1–8.1)
eGFR: 87 mL/min/{1.73_m2} (ref >=60)

## 2023-03-10 LAB — ANTI-NUCLEAR AB-TITER (ANA TITER)

## 2023-03-10 LAB — CBC WITH DIFFERENTIAL/PLATELET
Absolute Monocytes: 287 cells/uL (ref 200–950)
Basophils Absolute: 30 cells/uL (ref 0–200)
Basophils Relative: 0.9 %
Eosinophils Absolute: 40 cells/uL (ref 15–500)
Eosinophils Relative: 1.2 %
HCT: 35.9 % (ref 35.0–45.0)
Hemoglobin: 12.2 g/dL (ref 11.7–15.5)
Lymphs Abs: 667 cells/uL — ABNORMAL LOW (ref 850–3900)
MCH: 33.3 pg — ABNORMAL HIGH (ref 27.0–33.0)
MCHC: 34 g/dL (ref 32.0–36.0)
MCV: 98.1 fL (ref 80.0–100.0)
MPV: 8.8 fL (ref 7.5–12.5)
Monocytes Relative: 8.7 %
Neutro Abs: 2277 cells/uL (ref 1500–7800)
Neutrophils Relative %: 69 %
Platelets: 259 10*3/uL (ref 140–400)
RBC: 3.66 10*6/uL — ABNORMAL LOW (ref 3.80–5.10)
RDW: 11.5 % (ref 11.0–15.0)
Total Lymphocyte: 20.2 %
WBC: 3.3 10*3/uL — ABNORMAL LOW (ref 3.8–10.8)

## 2023-03-10 LAB — ANA: Anti Nuclear Antibody (ANA): POSITIVE — AB

## 2023-03-10 LAB — PROTEIN ELECTROPHORESIS, SERUM, WITH REFLEX
Albumin ELP: 4.1 g/dL (ref 3.8–4.8)
Alpha 1: 0.2 g/dL (ref 0.2–0.3)
Alpha 2: 0.7 g/dL (ref 0.5–0.9)
Beta 2: 0.5 g/dL (ref 0.2–0.5)
Beta Globulin: 0.5 g/dL (ref 0.4–0.6)
Gamma Globulin: 2.4 g/dL — ABNORMAL HIGH (ref 0.8–1.7)
Total Protein: 8.5 g/dL — ABNORMAL HIGH (ref 6.1–8.1)

## 2023-03-10 LAB — C3 AND C4
C3 Complement: 116 mg/dL (ref 83–193)
C4 Complement: 25 mg/dL (ref 15–57)

## 2023-03-10 LAB — SEDIMENTATION RATE: Sed Rate: 65 mm/h — ABNORMAL HIGH (ref 0–30)

## 2023-03-10 LAB — RHEUMATOID FACTOR: Rheumatoid fact SerPl-aCnc: 127 IU/mL — ABNORMAL HIGH (ref <14)

## 2023-03-10 NOTE — Progress Notes (Signed)
Sodium remains low--128 and total protein remains elevated. Please clarify if she sees a nephrologist.  WBC count remains low but has trended down to 3.3.  Absolute lymphocyte count remains low-stable.  2+ protein in the urine noted.  Please see a protein creatinine ratio can be added. ANA remains positive.  Sed rate remains elevated but has improved-65.  No abnormal protein bands noted on SPEP.  Complements within normal limits.  RF remains positive but is a lower titer.  Continue plaquenil as prescribed.   Check CBC with diff in 1 month.

## 2023-03-11 ENCOUNTER — Encounter: Payer: Self-pay | Admitting: *Deleted

## 2023-03-11 ENCOUNTER — Telehealth: Payer: Self-pay | Admitting: *Deleted

## 2023-03-11 DIAGNOSIS — Z79899 Other long term (current) drug therapy: Secondary | ICD-10-CM

## 2023-03-11 NOTE — Progress Notes (Signed)
Patient should follow-up with PCP in regards to hyponatremia if she has not undergone a workup in the past.

## 2023-03-11 NOTE — Telephone Encounter (Signed)
-----   Message from Gearldine Bienenstock, PA-C sent at 03/10/2023  4:46 PM EDT ----- Sodium remains low--128 and total protein remains elevated. Please clarify if she sees a nephrologist.  WBC count remains low but has trended down to 3.3.  Absolute lymphocyte count remains low-stable.  2+ protein in the urine noted.  Please see a protein creatinine ratio can be added. ANA remains positive.  Sed rate remains elevated but has improved-65.  No abnormal protein bands noted on SPEP.  Complements within normal limits.  RF remains positive but is a lower titer.  Continue plaquenil as prescribed.   Check CBC with diff in 1 month.

## 2023-03-12 ENCOUNTER — Ambulatory Visit
Admission: RE | Admit: 2023-03-12 | Discharge: 2023-03-12 | Disposition: A | Discharge status: Home / Self Care | Payer: Medicare Other | Source: Ambulatory Visit | Attending: Internal Medicine | Admitting: Internal Medicine

## 2023-03-12 DIAGNOSIS — Z1231 Encounter for screening mammogram for malignant neoplasm of breast: Secondary | ICD-10-CM

## 2023-03-19 ENCOUNTER — Encounter: Payer: Self-pay | Admitting: Rheumatology

## 2023-03-19 ENCOUNTER — Ambulatory Visit: Payer: Medicare Other | Attending: Rheumatology | Admitting: Rheumatology

## 2023-03-19 VITALS — BP 128/73 | HR 74 | Resp 13 | Ht 69.0 in | Wt 130.2 lb

## 2023-03-19 DIAGNOSIS — J479 Bronchiectasis, uncomplicated: Secondary | ICD-10-CM | POA: Diagnosis present

## 2023-03-19 DIAGNOSIS — M3509 Sicca syndrome with other organ involvement: Secondary | ICD-10-CM | POA: Diagnosis present

## 2023-03-19 DIAGNOSIS — Z853 Personal history of malignant neoplasm of breast: Secondary | ICD-10-CM | POA: Diagnosis present

## 2023-03-19 DIAGNOSIS — Z820 Family history of epilepsy and other diseases of the nervous system: Secondary | ICD-10-CM

## 2023-03-19 DIAGNOSIS — Z79899 Other long term (current) drug therapy: Secondary | ICD-10-CM

## 2023-03-19 DIAGNOSIS — R700 Elevated erythrocyte sedimentation rate: Secondary | ICD-10-CM | POA: Diagnosis present

## 2023-03-19 DIAGNOSIS — M810 Age-related osteoporosis without current pathological fracture: Secondary | ICD-10-CM

## 2023-03-19 DIAGNOSIS — R808 Other proteinuria: Secondary | ICD-10-CM

## 2023-03-19 DIAGNOSIS — R5382 Chronic fatigue, unspecified: Secondary | ICD-10-CM | POA: Diagnosis present

## 2023-03-19 DIAGNOSIS — R778 Other specified abnormalities of plasma proteins: Secondary | ICD-10-CM | POA: Diagnosis present

## 2023-03-19 DIAGNOSIS — R911 Solitary pulmonary nodule: Secondary | ICD-10-CM

## 2023-03-19 DIAGNOSIS — I10 Essential (primary) hypertension: Secondary | ICD-10-CM | POA: Diagnosis present

## 2023-03-19 DIAGNOSIS — D692 Other nonthrombocytopenic purpura: Secondary | ICD-10-CM | POA: Diagnosis present

## 2023-03-19 DIAGNOSIS — D508 Other iron deficiency anemias: Secondary | ICD-10-CM

## 2023-03-19 NOTE — Patient Instructions (Signed)
Vaccines You are taking a medication(s) that can suppress your immune system.  The following immunizations are recommended: Flu annually Covid-19  Td/Tdap (tetanus, diphtheria, pertussis) every 10 years Pneumonia (Prevnar 15 then Pneumovax 23 at least 1 year apart.  Alternatively, can take Prevnar 20 without needing additional dose) Shingrix: 2 doses from 4 weeks to 6 months apart  Please check with your PCP to make sure you are up to date.  

## 2023-03-20 LAB — PROTEIN / CREATININE RATIO, URINE
Creatinine, Urine: 47 mg/dL (ref 20–275)
Protein/Creat Ratio: 128 mg/g creat (ref 24–184)
Protein/Creatinine Ratio: 0.128 mg/mg creat (ref 0.024–0.184)
Total Protein, Urine: 6 mg/dL (ref 5–24)

## 2023-03-20 NOTE — Progress Notes (Signed)
Protein creatinine ratio is normal.  No further workup is needed.

## 2023-03-25 ENCOUNTER — Encounter: Payer: Self-pay | Admitting: Student

## 2023-04-07 ENCOUNTER — Inpatient Hospital Stay: Payer: Medicare Other | Attending: Hematology and Oncology

## 2023-04-07 ENCOUNTER — Other Ambulatory Visit: Payer: Self-pay

## 2023-04-07 ENCOUNTER — Inpatient Hospital Stay (HOSPITAL_BASED_OUTPATIENT_CLINIC_OR_DEPARTMENT_OTHER): Payer: Medicare Other | Admitting: Hematology and Oncology

## 2023-04-07 ENCOUNTER — Other Ambulatory Visit: Payer: Self-pay | Admitting: Hematology and Oncology

## 2023-04-07 VITALS — BP 138/79 | HR 79 | Temp 97.9°F | Resp 13 | Wt 130.1 lb

## 2023-04-07 DIAGNOSIS — R779 Abnormality of plasma protein, unspecified: Secondary | ICD-10-CM | POA: Insufficient documentation

## 2023-04-07 DIAGNOSIS — D500 Iron deficiency anemia secondary to blood loss (chronic): Secondary | ICD-10-CM | POA: Diagnosis not present

## 2023-04-07 DIAGNOSIS — D509 Iron deficiency anemia, unspecified: Secondary | ICD-10-CM | POA: Insufficient documentation

## 2023-04-07 DIAGNOSIS — Z85828 Personal history of other malignant neoplasm of skin: Secondary | ICD-10-CM | POA: Diagnosis not present

## 2023-04-07 DIAGNOSIS — K30 Functional dyspepsia: Secondary | ICD-10-CM | POA: Diagnosis not present

## 2023-04-07 DIAGNOSIS — Z803 Family history of malignant neoplasm of breast: Secondary | ICD-10-CM | POA: Diagnosis not present

## 2023-04-07 DIAGNOSIS — M8580 Other specified disorders of bone density and structure, unspecified site: Secondary | ICD-10-CM | POA: Insufficient documentation

## 2023-04-07 DIAGNOSIS — Z79899 Other long term (current) drug therapy: Secondary | ICD-10-CM | POA: Insufficient documentation

## 2023-04-07 DIAGNOSIS — Z87891 Personal history of nicotine dependence: Secondary | ICD-10-CM | POA: Diagnosis not present

## 2023-04-07 DIAGNOSIS — R1110 Vomiting, unspecified: Secondary | ICD-10-CM | POA: Diagnosis not present

## 2023-04-07 DIAGNOSIS — M3500 Sicca syndrome, unspecified: Secondary | ICD-10-CM | POA: Diagnosis not present

## 2023-04-07 DIAGNOSIS — R918 Other nonspecific abnormal finding of lung field: Secondary | ICD-10-CM | POA: Insufficient documentation

## 2023-04-07 LAB — CMP (CANCER CENTER ONLY)
ALT: 17 U/L (ref 0–44)
AST: 26 U/L (ref 15–41)
Albumin: 3.9 g/dL (ref 3.5–5.0)
Alkaline Phosphatase: 49 U/L (ref 38–126)
Anion gap: 5 (ref 5–15)
BUN: 14 mg/dL (ref 8–23)
CO2: 29 mmol/L (ref 22–32)
Calcium: 9.4 mg/dL (ref 8.9–10.3)
Chloride: 93 mmol/L — ABNORMAL LOW (ref 98–111)
Creatinine: 0.73 mg/dL (ref 0.44–1.00)
GFR, Estimated: >60 mL/min (ref >60)
Glucose, Bld: 99 mg/dL (ref 70–99)
Potassium: 4.1 mmol/L (ref 3.5–5.1)
Sodium: 127 mmol/L — ABNORMAL LOW (ref 135–145)
Total Bilirubin: 0.6 mg/dL (ref 0.3–1.2)
Total Protein: 8.3 g/dL — ABNORMAL HIGH (ref 6.5–8.1)

## 2023-04-07 LAB — CBC WITH DIFFERENTIAL (CANCER CENTER ONLY)
Abs Immature Granulocytes: 0.03 10*3/uL (ref 0.00–0.07)
Basophils Absolute: 0 10*3/uL (ref 0.0–0.1)
Basophils Relative: 1 %
Eosinophils Absolute: 0 10*3/uL (ref 0.0–0.5)
Eosinophils Relative: 1 %
HCT: 34.4 % — ABNORMAL LOW (ref 36.0–46.0)
Hemoglobin: 12 g/dL (ref 12.0–15.0)
Immature Granulocytes: 1 %
Lymphocytes Relative: 16 %
Lymphs Abs: 0.7 10*3/uL (ref 0.7–4.0)
MCH: 33.8 pg (ref 26.0–34.0)
MCHC: 34.9 g/dL (ref 30.0–36.0)
MCV: 96.9 fL (ref 80.0–100.0)
Monocytes Absolute: 0.3 10*3/uL (ref 0.1–1.0)
Monocytes Relative: 8 %
Neutro Abs: 2.9 10*3/uL (ref 1.7–7.7)
Neutrophils Relative %: 73 %
Platelet Count: 224 10*3/uL (ref 150–400)
RBC: 3.55 MIL/uL — ABNORMAL LOW (ref 3.87–5.11)
RDW: 12.6 % (ref 11.5–15.5)
WBC Count: 4 10*3/uL (ref 4.0–10.5)
nRBC: 0 % (ref 0.0–0.2)

## 2023-04-07 LAB — RETIC PANEL
Immature Retic Fract: 9.7 % (ref 2.3–15.9)
RBC.: 3.51 MIL/uL — ABNORMAL LOW (ref 3.87–5.11)
Retic Count, Absolute: 47.7 10*3/uL (ref 19.0–186.0)
Retic Ct Pct: 1.4 % (ref 0.4–3.1)
Reticulocyte Hemoglobin: 36.5 pg (ref >27.9)

## 2023-04-07 LAB — FERRITIN: Ferritin: 62 ng/mL (ref 11–307)

## 2023-04-07 LAB — IRON AND IRON BINDING CAPACITY (CC-WL,HP ONLY)
Iron: 128 ug/dL (ref 28–170)
Saturation Ratios: 32 % — ABNORMAL HIGH (ref 10.4–31.8)
TIBC: 395 ug/dL (ref 250–450)
UIBC: 267 ug/dL (ref 148–442)

## 2023-04-07 NOTE — Progress Notes (Signed)
[facility] [phone]   [phone]  PROGRESS NOTE  Patient Care Team: Morene Crocker, MD as PCP - General (Student)  Hematological/Oncological History # Lung Nodules  06/14/2021: CXR showed irregular nodular density is noted in right upper lobe during workup for productive cough x 3 months.  06/22/2021: establish care with Dr. Leonides Schanz  07/17/2021: CT scan shows peribronchovascular nodularity and mucoid impaction, likely explaining the concerning 'lung mass'  #Iron Deficiency Anemia/Anemia of Chronic Disease 04/08/2022: WBC 4.0, Hgb 11.2, MCV 97.9, Plt 219  Interval History:  April Howell 77 y.o. female with medical history significant for lung nodules and iron deficiency anemia who presents for a follow up visit. The patient's last visit was on 04/08/2022.   On exam today Mrs. Calliham reports she has been well overall in the interim since her last visit.  She reports that with her Sjogren's it is taking it "day to day".  She notes that is currently under good control.  Her energy levels are low but it does fluctuate from day-to-day.  She is not having any lightheadedness, dizziness, or shortness of breath.  She has not had any recent infections.  She reports that she continues to take her iron pills faithfully and they are causing any stomach upset or nausea, vomiting, or diarrhea.  She reports she takes it with orange juice and food.  Her energy is about a 7 out of 10 today.  Overall she feels well and has no comments questions or concerns at this time.  Otherwise denies any fevers, chills, sweats, nausea, vomiting or diarrhea.  A full 10 point ROS is listed below.  Today we discussed options moving forward including continued monitoring in our clinic or follow-up on an as-needed basis with her labs being checked by rheumatology/primary care.  She notes that she would prefer a as needed basis moving forward.  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     SURGICAL HISTORY: Past Surgical History:  Procedure Laterality Date   CATARACT EXTRACTION, BILATERAL Bilateral    05/06/2022 and 05/27/2022   MASTECTOMY Right     SOCIAL HISTORY: Social History   Socioeconomic History   Marital status: Married    Spouse name: Not on file   Number of children: Not on file   Years of education: Not on file   Highest education level: Not on file  Occupational History   Not on file  Tobacco Use   Smoking status: Former    Packs/day: 1.00    Years: 4.00    Additional pack years: 0.00    Total pack years: 4.00    Types: Cigarettes    Quit date: 9    Years since quitting: 52.4    Passive exposure: Past   Smokeless tobacco: Never  Vaping Use   Vaping Use: Never used  Substance and Sexual Activity   Alcohol use: Yes    Alcohol/week: 7.0 standard drinks of alcohol    Types: 7 Glasses of wine per week   Drug use: Never   Sexual activity: Yes    Partners: Male  Other Topics Concern   Not on file  Social History Narrative   Not on file   Social Determinants of Health  Financial Resource Strain: Not on file  Food Insecurity: Not on file  Transportation Needs: Not on file  Physical Activity: Not on file  Stress: Not on file  Social Connections: Not on file  Intimate Partner Violence: Not on file    FAMILY HISTORY: Family History  Problem Relation Age of Onset   Multiple sclerosis Mother    Heart attack Father    Heart disease Father    Hodgkin's lymphoma Sister    Breast cancer Sister 12   Basal cell carcinoma Sister    Healthy Son     ALLERGIES:  has No Known Allergies.  MEDICATIONS:  Current Outpatient Medications  Medication Sig Dispense Refill   CALCIUM PO Take 650 mg by mouth daily.     ferrous sulfate 325  (65 FE) MG EC tablet Take 325 mg by mouth daily with breakfast.     hydroxychloroquine (PLAQUENIL) 200 MG tablet Take 1 tablet by mouth twice a day Monday through Friday 120 tablet 0   Multiple Vitamin (MULTIVITAMIN) tablet Take 1 tablet by mouth daily.     Omega-3 Fatty Acids (FISH OIL) 1000 MG CAPS Take by mouth.     No current facility-administered medications for this visit.    REVIEW OF SYSTEMS:   Constitutional: ( - ) fevers, ( - )  chills , ( - ) night sweats Eyes: ( - ) blurriness of vision, ( - ) double vision, ( - ) watery eyes Ears, nose, mouth, throat, and face: ( - ) mucositis, ( - ) sore throat Respiratory: ( - ) cough, ( - ) dyspnea, ( - ) wheezes Cardiovascular: ( - ) palpitation, ( - ) chest discomfort, ( - ) lower extremity swelling Gastrointestinal:  ( - ) nausea, ( - ) heartburn, ( - ) change in bowel habits Skin: ( - ) abnormal skin rashes Lymphatics: ( - ) new lymphadenopathy, ( - ) easy bruising Neurological: ( - ) numbness, ( - ) tingling, ( - ) new weaknesses Behavioral/Psych: ( - ) mood change, ( - ) new changes  All other systems were reviewed with the patient and are negative.  PHYSICAL EXAMINATION:  Vitals:   04/07/23 1447  BP: 138/79  Pulse: 79  Resp: 13  Temp: 97.9 F (36.6 C)  SpO2: 100%     Filed Weights   04/07/23 1447  Weight: 130 lb 1.6 oz (59 kg)      GENERAL: Well-appearing elderly Caucasian female, alert, no distress and comfortable SKIN: skin color, texture, turgor are normal, no rashes or significant lesions EYES: conjunctiva are pink and non-injected, sclera clear LUNGS: clear to auscultation and percussion with normal breathing effort HEART: regular rate & rhythm and no murmurs and no lower extremity edema Musculoskeletal: no cyanosis of digits and no clubbing  PSYCH: alert & oriented x 3, fluent speech NEURO: no focal motor/sensory deficits  LABORATORY DATA:  I have reviewed the data as listed    Latest Ref Rng & Units  04/07/2023    2:30 PM 03/06/2023    1:47 PM 10/07/2022    1:49 PM  CBC  WBC 4.0 - 10.5 K/uL 4.0  3.3  3.7   Hemoglobin 12.0 - 15.0 g/dL 16.1  09.6  04.5   Hematocrit 36.0 - 46.0 % 34.4  35.9  34.4   Platelets 150 - 400 K/uL 224  259  249        Latest Ref Rng & Units 04/07/2023    2:30 PM 03/06/2023  1:47 PM 10/07/2022    1:49 PM  CMP  Glucose 70 - 99 mg/dL 99  96  161   BUN 8 - 23 mg/dL 14  13  17    Creatinine 0.44 - 1.00 mg/dL 0.96  0.45  4.09   Sodium 135 - 145 mmol/L 127  128  127   Potassium 3.5 - 5.1 mmol/L 4.1  4.5  4.1   Chloride 98 - 111 mmol/L 93  91  92   CO2 22 - 32 mmol/L 29  30  30    Calcium 8.9 - 10.3 mg/dL 9.4  9.6  9.8   Total Protein 6.5 - 8.1 g/dL 8.3  8.9    8.5  8.7   Total Bilirubin 0.3 - 1.2 mg/dL 0.6  0.7  0.7   Alkaline Phos 38 - 126 U/L 49   55   AST 15 - 41 U/L 26  25  27    ALT 0 - 44 U/L 17  17  18      Lab Results  Component Value Date   MPROTEIN Not Observed 10/08/2021   MPROTEIN Not Observed 06/22/2021   Lab Results  Component Value Date   KPAFRELGTCHN 113.8 (H) 10/08/2021   KPAFRELGTCHN 116.0 (H) 06/22/2021   LAMBDASER 63.1 (H) 10/08/2021   LAMBDASER 68.8 (H) 06/22/2021   KAPLAMBRATIO 1.80 (H) 10/08/2021   KAPLAMBRATIO 1.69 (H) 06/22/2021    RADIOGRAPHIC STUDIES: MM 3D SCREENING MAMMOGRAM UNILATERAL LEFT BREAST  Result Date: 03/14/2023 CLINICAL DATA:  Screening. EXAM: DIGITAL SCREENING UNILATERAL LEFT MAMMOGRAM WITH CAD AND TOMOSYNTHESIS TECHNIQUE: Left screening digital craniocaudal and mediolateral oblique mammograms were obtained. Left screening digital breast tomosynthesis was performed. The images were evaluated with computer-aided detection. COMPARISON:  Previous exam(s). ACR Breast Density Category c: The breasts are heterogeneously dense, which may obscure small masses. FINDINGS: The patient has had a right mastectomy. There are no findings suspicious for malignancy. IMPRESSION: No mammographic evidence of malignancy. A result  letter of this screening mammogram will be mailed directly to the patient. RECOMMENDATION: Screening mammogram in one year.  (Code:SM-L-56M) BI-RADS CATEGORY  1: Negative. Electronically Signed   By: Frederico Hamman M.D.   On: 03/14/2023 14:54    ASSESSMENT & PLAN Iceis Ebner 77 y.o. female with medical history significant for lung nodules and iron deficiency anemia who presents for a follow up visit.  #Normocytic Anemia #Iron deficiency anemia --labs today show hemoglobin 12.0, white blood cell count 4.0, MCV 96.9, and platelets of 224 May be component of anemia of chronic disease/inflammation driving down the hemoglobin. --Ferritin 56 at last visit.  Iron levels pending from today. --continue ferrous sulfate 325 mg p.o. daily. -- Return to clinic PRN.  Continue routine labs with PCP and rheumatologist.  #Lung Nodule --findings initially on Xray, CT scan on 07/17/2021 does not show a mass but concern for atypical infection --Patient has established care with pulmonology for evaluation and management of this possible atypical infection. --continue to monitor   #Elevated Serum Protein -- no evidence of a monoclonal protein on initial labs and our workup. --continue to monitor  No orders of the defined types were placed in this encounter.  All questions were answered. The patient knows to call the clinic with any problems, questions or concerns.  A total of more than 25 minutes were spent on this encounter with face-to-face time and non-face-to-face time, including preparing to see the patient, ordering tests and/or medications, counseling the patient and coordination of care as outlined above.   Jonny Ruiz  Ancil Linsey, MD Department of Hematology/Oncology [facility] Phone: [phone] Pager: [phone] Email: [email or web address]  04/07/2023 4:11 PM

## 2023-04-12 ENCOUNTER — Other Ambulatory Visit: Payer: Self-pay | Admitting: Physician Assistant

## 2023-04-14 NOTE — Telephone Encounter (Signed)
Last Fill: 01/14/2023  Eye exam: 01/01/2023 WNL    Labs: 04/07/2023 Sodium 127, Chloride 93, Total Protein 8.3, RBC 3.55, Hct 34.4  Next Visit: 08/19/2023  Last Visit: 03/19/2023  DX: Sjogren's syndrome with other organ involvement   Current Dose per office note 03/19/2023: Plaquenil 200 mg 1 tablet by mouth twice daily Monday through Friday.   Okay to refill Plaquenil?

## 2023-05-14 ENCOUNTER — Ambulatory Visit: Payer: Medicare Other | Admitting: Pulmonary Disease

## 2023-06-27 ENCOUNTER — Encounter: Payer: Self-pay | Admitting: Pulmonary Disease

## 2023-06-27 ENCOUNTER — Ambulatory Visit (INDEPENDENT_AMBULATORY_CARE_PROVIDER_SITE_OTHER): Payer: Medicare Other | Admitting: Pulmonary Disease

## 2023-06-27 VITALS — BP 116/80 | HR 78 | Temp 97.3°F | Ht 69.0 in | Wt 130.0 lb

## 2023-06-27 DIAGNOSIS — J479 Bronchiectasis, uncomplicated: Secondary | ICD-10-CM

## 2023-06-27 MED ORDER — FLUCONAZOLE 100 MG PO TABS
100.0000 mg | ORAL_TABLET | Freq: Every day | ORAL | 0 refills | Status: AC
Start: 2023-06-27 — End: 2023-07-04

## 2023-06-27 MED ORDER — IPRATROPIUM-ALBUTEROL 0.5-2.5 (3) MG/3ML IN SOLN
3.0000 mL | Freq: Four times a day (QID) | RESPIRATORY_TRACT | 3 refills | Status: DC | PRN
Start: 2023-06-27 — End: 2023-12-07

## 2023-06-27 MED ORDER — AMOXICILLIN-POT CLAVULANATE 875-125 MG PO TABS
1.0000 | ORAL_TABLET | Freq: Two times a day (BID) | ORAL | 0 refills | Status: DC
Start: 2023-06-27 — End: 2023-12-07

## 2023-06-27 NOTE — Progress Notes (Signed)
Synopsis: Referred in October 2022 for Mycobacterial Infection by Sanda Linger, MD  Subjective:   PATIENT ID: April Howell GENDER: female DOB: 10-30-1945, MRN: [id number]  HPI  Chief Complaint  Patient presents with   Follow-up    Breathing is good  Green Phlegm few years     April Howell is a 77 year old, former smoker who is returns to pulmonary clinic for bronchiectasis and sjogren's syndrome.   Initial OV 08/09/2021 She reports having a cough since May 2022 that is productive with greenish sputum intermittently. She is also experiencing fatigue/lack of energy and has been losing weight. The cough occurs when lying flat. She has no night time awakenings due to cough. She denies wheezing. Prior to May she denies issues with cough. She may experience cough with eating certain dry foods but this is not very common occurrence.   She denies history of frequent sinus or respiratory infections. She denies joint aches or skin rashes. She does complain of dry eyes and dry mouth. She does have GERD symptoms occasionally. She complains of easy bruising. She was recently evaluated by Dr. Leonides Schanz for elevated total protein, note reviewed from 06/22/21. Her immunoglobulin levels were not low from this evaluation.   She is a former smoker and quit in 1972. She has history of breast cancer s/p right mastectomy and chemotherapy. She did not have radiation therapy.   Today OV 06/27/23 She continues to have productive cough on a daily basis. She has not been using flutter valve or nebulizer treatments. She denies much shortness of breath or wheezing. Her weight has been stable. She is following with Dr. Corliss Skains for Sjogren's syndrome. 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 She has significant fatigue.   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   Osteopenia    Sjogren's syndrome (HCC)    Squamous  cell cancer of skin of left cheek      Family History  Problem Relation Age of Onset   Multiple sclerosis Mother    Heart attack Father    Heart disease Father    Hodgkin's lymphoma Sister    Breast cancer Sister 54   Basal cell carcinoma Sister    Healthy Son      Social History   Socioeconomic History   Marital status: Married    Spouse name: Not on file   Number of children: Not on file   Years of education: Not on file   Highest education level: Not on file  Occupational History   Not on file  Tobacco Use   Smoking status: Former    Current packs/day: 0.00    Average packs/day: 1 pack/day for 4.0 years (4.0 ttl pk-yrs)    Types: Cigarettes    Start date: 26    Quit date: 52    Years since quitting: 52.7    Passive exposure: Past   Smokeless tobacco: Never  Vaping Use   Vaping status: Never Used  Substance and Sexual Activity   Alcohol use: Yes    Alcohol/week: 7.0 standard drinks of alcohol    Types: 7 Glasses of wine per week   Drug use: Never   Sexual activity: Yes    Partners: Male  Other Topics Concern   Not on file  Social History Narrative   Not on file   Social Determinants of Health  Financial Resource Strain: Not on file  Food Insecurity: Not on file  Transportation Needs: Not on file  Physical Activity: Not on file  Stress: Not on file  Social Connections: Not on file  Intimate Partner Violence: Not on file     No Known Allergies   Outpatient Medications Prior to Visit  Medication Sig Dispense Refill   CALCIUM PO Take 650 mg by mouth daily.     ferrous sulfate 325 (65 FE) MG EC tablet Take 325 mg by mouth daily with breakfast.     hydroxychloroquine (PLAQUENIL) 200 MG tablet TAKE 1 TABLET BY MOUTH TWICE  A DAY MONDAY THROUGH FRIDAY 120 tablet 0   Multiple Vitamin (MULTIVITAMIN) tablet Take 1 tablet by mouth daily.     Omega-3 Fatty Acids (FISH OIL) 1000 MG CAPS Take by mouth.     No facility-administered medications prior to  visit.    Review of Systems  Constitutional:  Positive for malaise/fatigue. Negative for chills, fever and weight loss.  HENT:  Negative for congestion, sinus pain and sore throat.   Eyes: Negative.   Respiratory:  Positive for cough and sputum production. Negative for hemoptysis, shortness of breath and wheezing.   Cardiovascular:  Negative for chest pain, palpitations, orthopnea, claudication and leg swelling.  Gastrointestinal:  Negative for abdominal pain, heartburn, nausea and vomiting.  Genitourinary: Negative.   Musculoskeletal:  Negative for joint pain and myalgias.  Skin:  Negative for rash.  Neurological:  Negative for weakness.  Endo/Heme/Allergies: Negative.   Psychiatric/Behavioral: Negative.      Objective:   Vitals:   06/27/23 1313  BP: 116/80  Pulse: 78  Temp: (!) 97.3 F (36.3 C)  TempSrc: Temporal  SpO2: 99%  Weight: 130 lb (59 kg)  Height: 5\' 9"  (1.753 m)    Physical Exam Constitutional:      General: She is not in acute distress.    Appearance: She is not ill-appearing.  HENT:     Head: Normocephalic and atraumatic.  Eyes:     General: No scleral icterus.    Conjunctiva/sclera: Conjunctivae normal.  Cardiovascular:     Rate and Rhythm: Normal rate and regular rhythm.     Pulses: Normal pulses.     Heart sounds: Normal heart sounds. No murmur heard. Pulmonary:     Effort: Pulmonary effort is normal.     Breath sounds: Normal breath sounds. No wheezing, rhonchi or rales.  Musculoskeletal:     Right lower leg: No edema.     Left lower leg: No edema.  Skin:    General: Skin is warm and dry.  Neurological:     General: No focal deficit present.     Mental Status: She is alert.     CBC    Component Value Date/Time   WBC 4.0 04/07/2023 1430   WBC 3.3 (L) 03/06/2023 1347   RBC 3.51 (L) 04/07/2023 1430   RBC 3.55 (L) 04/07/2023 1430   HGB 12.0 04/07/2023 1430   HCT 34.4 (L) 04/07/2023 1430   PLT 224 04/07/2023 1430   MCV 96.9 04/07/2023  1430   MCH 33.8 04/07/2023 1430   MCHC 34.9 04/07/2023 1430   RDW 12.6 04/07/2023 1430   LYMPHSABS 0.7 04/07/2023 1430   MONOABS 0.3 04/07/2023 1430   EOSABS 0.0 04/07/2023 1430   BASOSABS 0.0 04/07/2023 1430      Latest Ref Rng & Units 04/07/2023    2:30 PM 03/06/2023    1:47 PM 10/07/2022    1:49 PM  BMP  Glucose 70 - 99 mg/dL 99  96  366   BUN 8 - 23 mg/dL 14  13  17    Creatinine 0.44 - 1.00 mg/dL 4.40  3.47  4.25   BUN/Creat Ratio 6 - 22 (calc)  SEE NOTE:    Sodium 135 - 145 mmol/L 127  128  127   Potassium 3.5 - 5.1 mmol/L 4.1  4.5  4.1   Chloride 98 - 111 mmol/L 93  91  92   CO2 22 - 32 mmol/L 29  30  30    Calcium 8.9 - 10.3 mg/dL 9.4  9.6  9.8    Chest imaging CT Chest 07/17/21 1. Right middle lobe, lingula and bilateral lower lobe bronchiectasis, peribronchovascular nodularity, mucoid impaction and scattered volume loss, findings typical of mycobacterium avium complex. This likely accounts for the questioned abnormality in the right upper lobe on recent chest radiograph. 2. 3 mm right solid pulmonary nodule within the upper lobe. If patient is low risk for malignancy, no routine follow-up imaging is recommended; if patient is high risk for malignancy, a non-contrast Chest CT at 12 months is optional.  PFT:     No data to display          Labs: 06/22/21 IgG 3,701 IgM 83 IgA 768  Path:  Echo:  Heart Catheterization:    Assessment & Plan:   Bronchiectasis without complication (HCC) - Plan: CT CHEST HIGH RESOLUTION, ipratropium-albuterol (DUONEB) 0.5-2.5 (3) MG/3ML SOLN, amoxicillin-clavulanate (AUGMENTIN) 875-125 MG tablet, fluconazole (DIFLUCAN) 100 MG tablet  Discussion: Orletta Solimine is a 78 year old, former smoker who is returns to pulmonary clinic for bronchiectasis and sjogren's syndrome.   She continues to have cough, mucous production and fatigue.   Recommend using flutter valve twice daily and if having trouble bringing up mucous then to use  duoneb nebulizer treatment twice daily.   We will treat her with 14 days of augmentin to see if this helps with her cough and fatigue. Diflucan prescription sent in case she develops yeast infection.   HRCT chest ordered.  Follow up in 4 months.  Melody Comas, MD [facility] Office: [phone]    Current Outpatient Medications:    amoxicillin-clavulanate (AUGMENTIN) 875-125 MG tablet, Take 1 tablet by mouth 2 (two) times daily., Disp: 28 tablet, Rfl: 0   CALCIUM PO, Take 650 mg by mouth daily., Disp: , Rfl:    ferrous sulfate 325 (65 FE) MG EC tablet, Take 325 mg by mouth daily with breakfast., Disp: , Rfl:    fluconazole (DIFLUCAN) 100 MG tablet, Take 1 tablet (100 mg total) by mouth daily for 7 days., Disp: 7 tablet, Rfl: 0   hydroxychloroquine (PLAQUENIL) 200 MG tablet, TAKE 1 TABLET BY MOUTH TWICE  A DAY MONDAY THROUGH FRIDAY, Disp: 120 tablet, Rfl: 0   ipratropium-albuterol (DUONEB) 0.5-2.5 (3) MG/3ML SOLN, Take 3 mLs by nebulization every 6 (six) hours as needed., Disp: 360 mL, Rfl: 3   Multiple Vitamin (MULTIVITAMIN) tablet, Take 1 tablet by mouth daily., Disp: , Rfl:    Omega-3 Fatty Acids (FISH OIL) 1000 MG CAPS, Take by mouth., Disp: , Rfl:

## 2023-06-27 NOTE — Patient Instructions (Addendum)
We will give/order you a new flutter valve  Use flutter valve twice daily ( 5 - 10 breaths per morning and evening)   Use duoneb nebulizer solution twice daily as needed for thick mucous  We will schedule you for CT Chest scan to monitor any changes in your lungs  Take fluconazole if needed for yeast infection  Follow up in 4 months

## 2023-06-27 NOTE — Addendum Note (Signed)
Addended by: Lanna Poche on: 06/27/2023 01:48 PM   Modules accepted: Orders

## 2023-07-03 ENCOUNTER — Other Ambulatory Visit: Payer: Self-pay | Admitting: Physician Assistant

## 2023-07-03 NOTE — Telephone Encounter (Signed)
Last Fill: 04/14/2023  Eye exam: 05/27/2023 WNL    Labs: 04/07/2023 Sodium 127, Chloride 93, RBC 3.55, Hct 34.4  Next Visit: 08/19/2023  Last Visit: 03/19/2023  ZO:[id number] syndrome with other organ involvement   Current Dose per office note 03/19/2023: Plaquenil 200 mg 1 tablet by mouth twice daily Monday through Friday.   Okay to refill Plaquenil?

## 2023-07-11 ENCOUNTER — Ambulatory Visit (HOSPITAL_COMMUNITY): Payer: Medicare Other

## 2023-07-31 ENCOUNTER — Ambulatory Visit (HOSPITAL_COMMUNITY)
Admission: RE | Admit: 2023-07-31 | Discharge: 2023-07-31 | Disposition: A | Discharge status: Home / Self Care | Payer: Medicare Other | Source: Ambulatory Visit | Attending: Pulmonary Disease | Admitting: Pulmonary Disease

## 2023-07-31 DIAGNOSIS — J479 Bronchiectasis, uncomplicated: Secondary | ICD-10-CM | POA: Insufficient documentation

## 2023-08-05 NOTE — Progress Notes (Unsigned)
Office Visit Note  Patient: April Howell             Date of Birth: 30-Dec-1945           MRN: [id number]             PCP: Morene Crocker, MD Referring: Belva Agee, * Visit Date: 08/19/2023 Occupation: @GUAROCC @  Subjective:  Sicca symptoms   History of Present Illness: April Howell is a 77 y.o. female with history of sjogren's syndrome and osteoporosis.  Patient remains on Plaquenil 200 mg 1 tablet by mouth twice daily Monday through Friday.  She is tolerating Plaquenil without any side effects and has not missed any doses recently.  Patient continues to have chronic sicca symptoms.  She has been seeing her optometrist every 6 months and had an updated Plaquenil eye examination on 05/27/2023.  She has also been seeing her dentist every 6 months as encouraged.  She denies any salivary stones or swollen lymph nodes.  Patient states that she reestablish care with Dr. Francine Graven and had a high-resolution chest CT on 07/31/2023 which did not reveal any findings of ILD.  She was treated with a 14-day course of Augmentin for bronchiectasis flare.  Her cough has completely resolved and she has not had any recurrence.  She will be following back up with Dr. Francine Graven in January 2025 for routine follow-up. She denies any joint pain or joint swelling at this time. Patient states that her primary care has been resting initiating Fosamax but she has been apprehensive of possible side effects such as osteonecrosis of the jaw.  She denies any upcoming dental work.  She has been taking a daily calcium and vitamin D supplement.    Activities of Daily Living:  Patient reports morning stiffness for 0 minutes.   Patient Denies nocturnal pain.  Difficulty dressing/grooming: Denies Difficulty climbing stairs: Denies Difficulty getting out of chair: Denies Difficulty using hands for taps, buttons, cutlery, and/or writing: Denies  Review of Systems  Constitutional:  Positive for fatigue.  HENT:   Positive for mouth dryness and nose dryness. Negative for mouth sores.   Eyes:  Positive for dryness.  Respiratory:  Negative for shortness of breath.   Cardiovascular:  Negative for chest pain and palpitations.  Gastrointestinal:  Negative for blood in stool, constipation and diarrhea.  Endocrine: Negative for increased urination.  Genitourinary:  Negative for involuntary urination.  Musculoskeletal:  Positive for joint pain, joint pain and joint swelling. Negative for gait problem, myalgias, muscle weakness, morning stiffness, muscle tenderness and myalgias.  Skin:  Positive for sensitivity to sunlight. Negative for color change, rash and hair loss.  Allergic/Immunologic: Negative for susceptible to infections.  Neurological:  Negative for dizziness and headaches.  Hematological:  Negative for swollen glands.  Psychiatric/Behavioral:  Negative for depressed mood and sleep disturbance. The patient is nervous/anxious.     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 06/15/2021   Nodule of upper lobe of right lung 06/15/2021   Purpura (HCC) 06/14/2021   Chronic fatigue 06/14/2021   Primary hypertension 06/14/2021   Cough productive of purulent sputum 06/14/2021   Abdominal bruit 06/14/2021   Elevated total protein 06/14/2021    Past Medical History:  Diagnosis Date   Anemia    Basal cell  carcinoma    Breast cancer (HCC)    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   Osteopenia    Sjogren's syndrome (HCC)    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     Family History  Problem Relation Age of Onset   Multiple sclerosis Mother    Heart attack Father    Heart disease Father    Hodgkin's lymphoma Sister    Breast cancer Sister 52   Basal cell carcinoma Sister     Healthy Son    Past Surgical History:  Procedure Laterality Date   CATARACT EXTRACTION, BILATERAL Bilateral    05/06/2022 and 05/27/2022   MASTECTOMY Right    Social History   Social History Narrative   Not on file   Immunization History  Administered Date(s) Administered   PFIZER(Purple Top)SARS-COV-2 Vaccination 11/12/2019, 12/03/2019, 07/19/2020, 02/14/2021     Objective: Vital Signs: BP 138/83 (BP Location: Left Arm, Patient Position: Sitting, Cuff Size: Normal)   Pulse 84   Resp 14   Ht 5\' 9"  (1.753 m)   Wt 126 lb 3.2 oz (57.2 kg)   BMI 18.64 kg/m    Physical Exam Vitals and nursing note reviewed.  Constitutional:      Appearance: She is well-developed.  HENT:     Head: Normocephalic and atraumatic.  Eyes:     Conjunctiva/sclera: Conjunctivae normal.  Cardiovascular:     Rate and Rhythm: Normal rate and regular rhythm.     Heart sounds: Normal heart sounds.  Pulmonary:     Effort: Pulmonary effort is normal.     Breath sounds: Normal breath sounds.  Abdominal:     General: Bowel sounds are normal.     Palpations: Abdomen is soft.  Musculoskeletal:     Cervical back: Normal range of motion.  Lymphadenopathy:     Cervical: No cervical adenopathy.  Skin:    General: Skin is warm and dry.     Capillary Refill: Capillary refill takes less than 2 seconds.  Neurological:     Mental Status: She is alert and oriented to person, place, and time.  Psychiatric:        Behavior: Behavior normal.      Musculoskeletal Exam: C-spine, thoracic spine, lumbar spine good range of motion with no discomfort.  Shoulder joints, elbow joints, wrist joints, MCPs, PIPs, DIPs have good range of motion with no synovitis.  Complete fist formation bilaterally.  Hip joints have good range of motion with no groin pain.  Knee joints have good range of motion no warmth or effusion.  Ankle joints have good range of motion with no tenderness or joint swelling.  CDAI Exam: CDAI Score:  -- Patient Global: --; Provider Global: -- Swollen: --; Tender: -- Joint Exam 08/19/2023   No joint exam has been documented for this visit   There is currently no information documented on the homunculus. Go to the Rheumatology activity and complete the homunculus joint exam.  Investigation: No additional findings.  Imaging: CT CHEST HIGH RESOLUTION  Result Date: 08/11/2023 CLINICAL DATA:  Bronchiectasis.  Interstitial lung disease. EXAM: CT CHEST WITHOUT CONTRAST TECHNIQUE: Multidetector CT imaging of the chest was performed following the standard protocol without intravenous contrast. High resolution imaging of the lungs, as well as inspiratory and expiratory imaging, was performed. RADIATION DOSE REDUCTION: This exam was performed according to the departmental dose-optimization program which includes automated exposure control, adjustment of the mA and/or kV according to patient size and/or use of iterative reconstruction technique. COMPARISON:  07/17/2021. FINDINGS: Cardiovascular: Atherosclerotic calcification of the aorta, aortic valve and coronary arteries. Heart is at the upper limits of normal in size to mildly enlarged. No pericardial effusion. Mediastinum/Nodes: 12 mm low-attenuation lesion in the right thyroid. No follow-up recommended. (Ref: J Am Coll Radiol. 2015 Feb;12(2): 143-50).No pathologically enlarged mediastinal or axillary lymph nodes. Hilar regions are difficult to definitively evaluate without IV contrast. Esophagus is grossly unremarkable. Lungs/Pleura: Mild cylindrical bronchiectasis. Associated mucoid impaction, volume loss and scattered nodularity, mid and lower lung zone predominant, overall improved from 07/17/2021. Negative for subpleural reticulation, traction bronchiectasis/bronchiolectasis, ground glass, architectural distortion or honeycombing. 3 mm apical right upper lobe nodule (6/46), unchanged and benign. Per Fleischner Society guidelines, no follow-up is  necessary. No pleural fluid. Airway is unremarkable. Expiratory phase imaging does not appear to have been performed in true expiration, limiting the evaluation for air trapping. Upper Abdomen: Probable small right hepatic lobe cyst. Visualized portions of the liver, adrenal glands, kidneys, spleen, pancreas and stomach are grossly unremarkable. Musculoskeletal: Slight compression of a midthoracic vertebral body, unchanged. Osteopenia. IMPRESSION: 1. No evidence of fibrotic interstitial lung disease. 2. Cylindrical bronchiectasis, mucoid impaction, volume loss and scattered nodularity, mid and lower lung zone predominant, overall improved from 07/17/2021. 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 3. Aortic atherosclerosis (ICD10-I70.0). Coronary artery calcification. Electronically Signed   By: Leanna Battles M.D.   On: 08/11/2023 15:52    Recent Labs: Lab Results  Component Value Date   WBC 4.0 04/07/2023   HGB 12.0 04/07/2023   PLT 224 04/07/2023   NA 127 (L) 04/07/2023   K 4.1 04/07/2023   CL 93 (L) 04/07/2023   CO2 29 04/07/2023   GLUCOSE 99 04/07/2023   BUN 14 04/07/2023   CREATININE 0.73 04/07/2023   BILITOT 0.6 04/07/2023   ALKPHOS 49 04/07/2023   AST 26 04/07/2023   ALT 17 04/07/2023   PROT 8.3 (H) 04/07/2023   ALBUMIN 3.9 04/07/2023   CALCIUM 9.4 04/07/2023   QFTBGOLDPLUS NEGATIVE 10/10/2021    Speciality Comments: PLQ Eye Exam: 05/27/2023 WNL @ [facility] Follow up in 1 year PLQ started 02/02/20223  Procedures:  No procedures performed Allergies: Patient has no known allergies.    Assessment / Plan:     Visit Diagnoses: Sjogren's syndrome with other organ involvement (HCC) - - Positive ANA, positive Ro, positive La, positive RF, sicca symptoms (dry mouth, dry eyes, dry skin, vaginal dryness): Patient continues to have chronic sicca symptoms.  Overall her symptoms have been stable.  No salivary stones.  No symptoms of cervical lymphadenopathy.   No  parotid swelling or tenderness.  Patient continues to see her dentist every 6 months and optometry every 6 months as advised. She has been taking Plaquenil 200 mg 1 tablet by mouth twice daily Monday through Friday.  She is tolerating Plaquenil without any side effects and has not had any interruptions in therapy.  She has no signs of inflammatory arthritis at this time.  No synovitis noted on exam. She is not currently experiencing any new or worsening pulmonary symptoms.  She reestablish care with Dr. Francine Graven on 06/27/2023 for follow-up of bronchiectasis.  At that time she was prescribed a 14-day course of Augmentin which resolved the cough she was experiencing.  She had a high-resolution chest CT updated on 07/31/2023 which did not reveal any findings of fibrotic interstitial lung disease. Discussed the increased risk for developing lymphoma in patients with Sjogren's syndrome.  SPEP was normal on 03/06/2023. Lab work  on 03/06/2023 was reviewed today in the office: SPEP was normal, RF 127, sed rate 65, complements within normal limits, ANA 1: 1280 NS.  The following lab work will be updated today for further evaluation.  She was advised to notify us if she develops any new or worsening symptoms.  She will follow-up in the office in 5 months or sooner if needed.  - Plan: CBC with Differential/Platelet, COMPLETE METABOLIC PANEL WITH GFR, Urinalysis, Routine w reflex microscopic, Sedimentation rate, C3 and C4, Serum protein electrophoresis with reflex, Rheumatoid factor  High risk medication use - Plaquenil 200 mg 1 tablet by mouth twice daily Monday through Friday.  PLQ Eye Exam: 05/27/2023 WNL @ [facility] Follow up in 1 year.   CBC and CMP updated on 04/07/23. Orders for CBC and CMP released today.   - Plan: CBC with Differential/Platelet, COMPLETE METABOLIC PANEL WITH GFR  Elevated sed rate -Sed rate was 65 on 03/06/2023.  No synovitis noted on examination today. ESR will be rechecked today.  Plan:  Sedimentation rate  Age-related osteoporosis without current pathological fracture - DEXA updated on 09/16/22: The BMD measured at Forearm Radius 33% is 0.469 g/cm2 with a T-score of -4.6.  She is taking a calcium and vitamin D supplement daily. Patient was previously prescribed alendronate by her PCP.  Her primary care has recommended resuming alendronate but she is apprehensive of possible side effects including osteonecrosis of the jaw.  Patient has no upcoming dental work.  Patient plans on reaching out to her PCP to resume alendronate.  Primary hypertension: Blood pressure was 138/83 today in the office.  Chronic fatigue: Chronic, stable.  Encouraged patient to start exercising on a regular basis.   Bronchiectasis without complication (HCC) - Followed by Dr. Francine Graven.  Evaluated by Dr. Francine Graven on 06/27/2023-treated with a course of Augmentin which resolved the cough and mucus production she was experiencing.  High-resolution chest CT updated on 07/31/2023: No evidence of fibrotic interstitial lung disease.  Cylindrical bronchiectasis, mucoid impaction, volume loss, and scattered nodularity mid and lower lung zone predominant, overall improved from 07/17/2021.  Other medical conditions are listed as follows:   Senile purpura (HCC)  Nodule of upper lobe of right lung  Abnormal SPEP: SPEP was normal on 03/06/2023.  Other iron deficiency anemia: White blood cell count was 3.55, hemoglobin 12.0, and hematocrit 34.4 on 04/07/2023.  CBC updated today.  History of breast cancer  Purpura (HCC)  Family history of multiple sclerosis  Other proteinuria  Orders: Orders Placed This Encounter  Procedures   CBC with Differential/Platelet   COMPLETE METABOLIC PANEL WITH GFR   Urinalysis, Routine w reflex microscopic   Sedimentation rate   C3 and C4   Serum protein electrophoresis with reflex   Rheumatoid factor   No orders of the defined types were placed in this encounter.   Follow-Up  Instructions: Return in about 5 months (around 01/17/2024) for Sjogren's syndrome, Osteoporosis.   Gearldine Bienenstock, PA-C  Note - This record has been created using Dragon software.  Chart creation errors have been sought, but may not always  have been located. Such creation errors do not reflect on  the standard of medical care.

## 2023-08-19 ENCOUNTER — Ambulatory Visit: Payer: Medicare Other | Attending: Physician Assistant | Admitting: Physician Assistant

## 2023-08-19 ENCOUNTER — Encounter: Payer: Self-pay | Admitting: Physician Assistant

## 2023-08-19 VITALS — BP 138/83 | HR 84 | Resp 14 | Ht 69.0 in | Wt 126.2 lb

## 2023-08-19 DIAGNOSIS — R700 Elevated erythrocyte sedimentation rate: Secondary | ICD-10-CM

## 2023-08-19 DIAGNOSIS — R5382 Chronic fatigue, unspecified: Secondary | ICD-10-CM

## 2023-08-19 DIAGNOSIS — I10 Essential (primary) hypertension: Secondary | ICD-10-CM | POA: Diagnosis present

## 2023-08-19 DIAGNOSIS — D692 Other nonthrombocytopenic purpura: Secondary | ICD-10-CM | POA: Diagnosis present

## 2023-08-19 DIAGNOSIS — R778 Other specified abnormalities of plasma proteins: Secondary | ICD-10-CM | POA: Diagnosis present

## 2023-08-19 DIAGNOSIS — D508 Other iron deficiency anemias: Secondary | ICD-10-CM

## 2023-08-19 DIAGNOSIS — R911 Solitary pulmonary nodule: Secondary | ICD-10-CM

## 2023-08-19 DIAGNOSIS — Z853 Personal history of malignant neoplasm of breast: Secondary | ICD-10-CM

## 2023-08-19 DIAGNOSIS — M810 Age-related osteoporosis without current pathological fracture: Secondary | ICD-10-CM

## 2023-08-19 DIAGNOSIS — M3509 Sicca syndrome with other organ involvement: Secondary | ICD-10-CM | POA: Diagnosis present

## 2023-08-19 DIAGNOSIS — R808 Other proteinuria: Secondary | ICD-10-CM

## 2023-08-19 DIAGNOSIS — J479 Bronchiectasis, uncomplicated: Secondary | ICD-10-CM

## 2023-08-19 DIAGNOSIS — Z79899 Other long term (current) drug therapy: Secondary | ICD-10-CM

## 2023-08-19 DIAGNOSIS — Z820 Family history of epilepsy and other diseases of the nervous system: Secondary | ICD-10-CM | POA: Diagnosis present

## 2023-08-20 NOTE — Progress Notes (Signed)
Absolute lymphocytes are borderline low. RBC count is borderline low but has improved. Rest of CBC WNL.  Sodium and chloride remain low-please forward to PCP.ESR remains elevated but has improved.  RF remains positive but titer is trending down.  UA normal.  Complements WNL SPEP pending

## 2023-08-21 LAB — PROTEIN ELECTROPHORESIS, SERUM, WITH REFLEX
Albumin ELP: 4.2 g/dL (ref 3.8–4.8)
Alpha 1: 0.2 g/dL (ref 0.2–0.3)
Alpha 2: 0.7 g/dL (ref 0.5–0.9)
Beta 2: 0.5 g/dL (ref 0.2–0.5)
Beta Globulin: 0.5 g/dL (ref 0.4–0.6)
Gamma Globulin: 2.2 g/dL — ABNORMAL HIGH (ref 0.8–1.7)
Total Protein: 8.2 g/dL — ABNORMAL HIGH (ref 6.1–8.1)

## 2023-08-21 LAB — CBC WITH DIFFERENTIAL/PLATELET
Absolute Lymphocytes: 710 {cells}/uL — ABNORMAL LOW (ref 850–3900)
Absolute Monocytes: 355 {cells}/uL (ref 200–950)
Basophils Absolute: 31 {cells}/uL (ref 0–200)
Basophils Relative: 0.8 %
Eosinophils Absolute: 31 {cells}/uL (ref 15–500)
Eosinophils Relative: 0.8 %
HCT: 36 % (ref 35.0–45.0)
Hemoglobin: 11.9 g/dL (ref 11.7–15.5)
MCH: 32.6 pg (ref 27.0–33.0)
MCHC: 33.1 g/dL (ref 32.0–36.0)
MCV: 98.6 fL (ref 80.0–100.0)
MPV: 8.8 fL (ref 7.5–12.5)
Monocytes Relative: 9.1 %
Neutro Abs: 2773 {cells}/uL (ref 1500–7800)
Neutrophils Relative %: 71.1 %
Platelets: 265 10*3/uL (ref 140–400)
RBC: 3.65 10*6/uL — ABNORMAL LOW (ref 3.80–5.10)
RDW: 11.2 % (ref 11.0–15.0)
Total Lymphocyte: 18.2 %
WBC: 3.9 10*3/uL (ref 3.8–10.8)

## 2023-08-21 LAB — URINALYSIS, ROUTINE W REFLEX MICROSCOPIC
Bilirubin Urine: NEGATIVE
Glucose, UA: NEGATIVE
Hgb urine dipstick: NEGATIVE
Ketones, ur: NEGATIVE
Leukocytes,Ua: NEGATIVE
Nitrite: NEGATIVE
Protein, ur: NEGATIVE
Specific Gravity, Urine: 1.006 (ref 1.001–1.035)
pH: 6.5 (ref 5.0–8.0)

## 2023-08-21 LAB — COMPLETE METABOLIC PANEL WITH GFR
AG Ratio: 1 (calc) (ref 1.0–2.5)
ALT: 19 U/L (ref 6–29)
AST: 28 U/L (ref 10–35)
Albumin: 4.3 g/dL (ref 3.6–5.1)
Alkaline phosphatase (APISO): 60 U/L (ref 37–153)
BUN: 15 mg/dL (ref 7–25)
CO2: 28 mmol/L (ref 20–32)
Calcium: 9.7 mg/dL (ref 8.6–10.4)
Chloride: 91 mmol/L — ABNORMAL LOW (ref 98–110)
Creat: 0.72 mg/dL (ref 0.60–1.00)
Globulin: 4.1 g/dL — ABNORMAL HIGH (ref 1.9–3.7)
Glucose, Bld: 88 mg/dL (ref 65–99)
Potassium: 4.5 mmol/L (ref 3.5–5.3)
Sodium: 127 mmol/L — ABNORMAL LOW (ref 135–146)
Total Bilirubin: 0.6 mg/dL (ref 0.2–1.2)
Total Protein: 8.4 g/dL — ABNORMAL HIGH (ref 6.1–8.1)
eGFR: 87 mL/min/{1.73_m2} (ref >=60)

## 2023-08-21 LAB — C3 AND C4
C3 Complement: 107 mg/dL (ref 83–193)
C4 Complement: 17 mg/dL (ref 15–57)

## 2023-08-21 LAB — RHEUMATOID FACTOR: Rheumatoid fact SerPl-aCnc: 103 [IU]/mL — ABNORMAL HIGH (ref <14)

## 2023-08-21 LAB — SEDIMENTATION RATE: Sed Rate: 56 mm/h — ABNORMAL HIGH (ref 0–30)

## 2023-08-22 NOTE — Progress Notes (Signed)
SPEP  normal.

## 2023-09-15 ENCOUNTER — Ambulatory Visit: Payer: Medicare Other

## 2023-09-15 ENCOUNTER — Encounter: Payer: Self-pay | Admitting: Student

## 2023-09-15 ENCOUNTER — Ambulatory Visit (INDEPENDENT_AMBULATORY_CARE_PROVIDER_SITE_OTHER): Payer: Medicare Other | Admitting: Student

## 2023-09-15 VITALS — BP 129/56 | HR 91 | Temp 97.7°F | Ht 69.0 in | Wt 127.2 lb

## 2023-09-15 DIAGNOSIS — D539 Nutritional anemia, unspecified: Secondary | ICD-10-CM | POA: Diagnosis not present

## 2023-09-15 DIAGNOSIS — M810 Age-related osteoporosis without current pathological fracture: Secondary | ICD-10-CM

## 2023-09-15 DIAGNOSIS — M3500 Sicca syndrome, unspecified: Secondary | ICD-10-CM | POA: Diagnosis not present

## 2023-09-15 DIAGNOSIS — M818 Other osteoporosis without current pathological fracture: Secondary | ICD-10-CM

## 2023-09-15 DIAGNOSIS — E871 Hypo-osmolality and hyponatremia: Secondary | ICD-10-CM

## 2023-09-15 DIAGNOSIS — Z0000 Encounter for general adult medical examination without abnormal findings: Secondary | ICD-10-CM

## 2023-09-15 DIAGNOSIS — I10 Essential (primary) hypertension: Secondary | ICD-10-CM

## 2023-09-15 DIAGNOSIS — R911 Solitary pulmonary nodule: Secondary | ICD-10-CM | POA: Diagnosis not present

## 2023-09-15 DIAGNOSIS — R5382 Chronic fatigue, unspecified: Secondary | ICD-10-CM

## 2023-09-15 NOTE — Assessment & Plan Note (Addendum)
Sjogren's is stably controlled with Plaquenil 200 mg BID on M-F. She is experiencing minimal side effects from this and feels it is helping. She reports minor photosensitivity and adherence to sunscreen application. She follows closely with a dentist, and has several PRN drops, sprays, and washes to manage her symptoms. - Continue Plaquenil 200 mg BID M-F - Continue following with rheumatology

## 2023-09-15 NOTE — Progress Notes (Addendum)
This is a Psychologist, occupational Note.  The care of the patient was discussed with Dr. Antony Contras and the assessment and plan was formulated with their assistance.  Please see their note for official documentation of the patient encounter.   Subjective:   Patient ID: April Howell female   DOB: October 14, 1946 77 y.o.   MRN: [id number]  HPI: April Howell is a 77 y.o. female presenting for annual well check.  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 (Patient not taking: Reported on 08/19/2023) 28 tablet 0   CALCIUM PO Take 650 mg by mouth daily.     ferrous sulfate 325 (65 FE) MG EC tablet Take 325 mg by mouth daily with breakfast.     hydroxychloroquine (PLAQUENIL) 200 MG tablet TAKE 1 TABLET BY MOUTH TWICE A DAY MONDAY THROUGH FRIDAY 120 tablet 0   ipratropium-albuterol (DUONEB) 0.5-2.5 (3) MG/3ML SOLN Take 3 mLs by nebulization every 6 (six) hours as needed. (Patient not taking: Reported on 08/19/2023) 360 mL 3   Multiple Vitamin (MULTIVITAMIN) tablet Take 1 tablet by mouth daily.     Omega-3 Fatty Acids (FISH OIL) 1000 MG CAPS Take by mouth.     No current facility-administered medications for this visit.   Family History  Problem Relation Age of Onset   Multiple sclerosis Mother    Heart attack Father    Heart disease Father    Hodgkin's lymphoma Sister    Breast cancer Sister 32   Basal cell carcinoma Sister    Healthy Son    Social History   Socioeconomic History   Marital status: Married    Spouse name: Not on file   Number of children: Not on file   Years of education: Not on file   Highest education level: Not on file   Occupational History   Not on file  Tobacco Use   Smoking status: Former    Current packs/day: 0.00    Average packs/day: 1 pack/day for 4.0 years (4.0 ttl pk-yrs)    Types: Cigarettes    Start date: 67    Quit date: 68    Years since quitting: 52.9    Passive exposure: Past   Smokeless tobacco: Never  Vaping Use   Vaping status: Never Used  Substance and Sexual Activity   Alcohol use: Yes    Alcohol/week: 7.0 standard drinks of alcohol    Types: 7 Glasses of wine per week   Drug use: Never   Sexual activity: Yes    Partners: Male  Other Topics Concern   Not on file  Social History Narrative   Not on file   Social Determinants of Health   Financial Resource Strain: Low Risk  (09/15/2023)   Overall Financial Resource Strain (CARDIA)    Difficulty of Paying Living Expenses: Not hard at all  Food Insecurity: No Food Insecurity (09/15/2023)   Hunger Vital Sign    Worried About Running Out of Food in the Last Year: Never true    Ran Out of Food in the Last Year: Never true  Transportation Needs: No Transportation Needs (  09/15/2023)   PRAPARE - Administrator, Civil Service (Medical): No    Lack of Transportation (Non-Medical): No  Physical Activity: Insufficiently Active (09/15/2023)   Exercise Vital Sign    Days of Exercise per Week: 3 days    Minutes of Exercise per Session: 20 min  Stress: Stress Concern Present (09/15/2023)   [employer] of Occupational Health - Occupational Stress Questionnaire    Feeling of Stress : To some extent  Social Connections: Moderately Isolated (09/15/2023)   Social Connection and Isolation Panel [NHANES]    Frequency of Communication with Friends and Family: Twice a week    Frequency of Social Gatherings with Friends and Family: Three times a week    Attends Religious Services: Never    Active Member of Clubs or Organizations: No    Attends Banker Meetings: Never    Marital Status: Married    Review of Systems: Pertinent items noted in HPI and remainder of comprehensive ROS otherwise negative.  Objective:  Physical Exam: Vitals:   09/15/23 1327  BP: (!) 129/56  Pulse: 91  Temp: 97.7 F (36.5 C)  TempSrc: Oral  SpO2: 100%  Weight: 127 lb 3.2 oz (57.7 kg)  Height: 5\' 9"  (1.753 m)   BP (!) 129/56 (BP Location: Left Arm, Patient Position: Sitting, Cuff Size: Normal)   Pulse 91   Temp 97.7 F (36.5 C) (Oral)   Ht 5\' 9"  (1.753 m)   Wt 127 lb 3.2 oz (57.7 kg)   SpO2 100%   BMI 18.78 kg/m  General appearance: alert, cooperative, and no distress ENT: Normocephalic, atraumatic. Mucus membranes moist, notable dental work. Torus palatinus noted. Lungs: clear to auscultation bilaterally, regular work of breathing on room air Heart: regular rate and rhythm, S1, S2 normal, no murmur, click, rub or gallop Neuro: Alert and oriented, grossly normal gait  Skin: warm and dry Psych: Appropriate affect and pleasant disposition.   Assessment & Plan:  Chronic fatigue Seems to be improved with treatment of Sjogren's and iron supplementation. 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 Is able to take regular walks where she enjoys the sounds of birds.   Primary hypertension Today 129/56, similar to 122/65 at last visit on 12/19/2021. Managed with lifestyle modifications.  Deficiency anemia Currently followed by oncology. Hgb has been stable around 12 and last iron study on 6/17 was unremarkable. Has been taking her ferrous sulfate 325 mg daily and is taking walks when she can. - Continue ferrous sulfate supplementation - Encourage exercise as tolerated  Osteoporosis History of osteoporosis treated by aldenoate for 5 years, followed by a 5 year drug holiday. Previous DEXA was performed in [place] over a decade ago. DEXA on 09/16/2022 showed a T-score of -4.6. Without her previous DEXA, unclear if this is stable or worsening. Ordered repeat DEXA for comparison prior  to restarting bisphosphonate therapy. She does not have any dental work planned and experienced no side effects with her previous course. She has never had any fractures. - Repeat DEXA within 1 month - Follow up in 3 months  Sjogren's syndrome (HCC) Sjogren's is stably controlled with Plaquenil 200 mg BID on M-F. She is experiencing minimal side effects from this and feels it is helping. She reports minor photosensitivity and adherence to sunscreen application. She follows closely with a dentist, and has several PRN drops, sprays, and washes to manage her symptoms. - Continue Plaquenil 200 mg BID M-F - Continue following with rheumatology  Nodule of  upper lobe of right lung Worked up by pulmonology and determined to be findings typical of mycobacterium avium complex. 3 mm nodule in R upper lung considered low-risk for malignancy, requiring no routine follow up. HRCT showed no evidence of ILD.   Hyponatremia Persistent hyponatremia previously suspected to involve an SIADH process or possible polydipsia from her sicca symptoms. She reports drinking 3-4 L of water each day and was thinking of changing one of those bottles to include an electrolyte supplement. Is not symptomatic at this time. Counseled for neurological symptoms for which to seek care.  - Continue to monitor   Thea Alken, MS3 Attestation for Student Documentation:  I personally was present and re-performed the history, physical exam and medical decision-making activities of this service and have verified that the service and findings are accurately documented in the student's note.  Morene Crocker, MD 09/15/2023, 2:53 PM

## 2023-09-15 NOTE — Assessment & Plan Note (Addendum)
Today 129/56, similar to 122/65 at last visit on 12/19/2021. Managed with lifestyle modifications.

## 2023-09-15 NOTE — Assessment & Plan Note (Signed)
Seems to be improved with treatment of Sjogren's and iron supplementation. 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 Is able to take regular walks where she enjoys the sounds of birds.

## 2023-09-15 NOTE — Assessment & Plan Note (Addendum)
Currently followed by oncology. Hgb has been stable around 12 and last iron study on 6/17 was unremarkable. Has been taking her ferrous sulfate 325 mg daily and is taking walks when she can. - Continue ferrous sulfate supplementation - Encourage exercise as tolerated

## 2023-09-15 NOTE — Progress Notes (Signed)
,  Attestation for Student Documentation:  I personally was present and re-performed the history, physical exam and medical decision-making activities of this service and have verified that the service and findings are accurately documented in the student's note.  Morene Crocker, MD 09/15/2023, 2:53 PM

## 2023-09-15 NOTE — Patient Instructions (Addendum)
Thank you, Ms.Izetta Dakin for allowing [place] to provide your care today. Glad to hear that things seem to be going well!   Osteoporosis: We would like you to have another DEXA scan so that we can compare to the one from last year and see if and how the osteoporosis is progressing before starting you on medication again.   Fatigue: It sounds like things are improving between the Plaquenil and keeping your iron up. Continue taking these medications and making sure to get out on those walks when you can.  Follow up: 3 months   We look forward to seeing you next time. Please call our clinic at [phone] if you have any questions or concerns. The best time to call is Monday-Friday from 9am-4pm, but there is someone available 24/7. If after hours or the weekend, call the main hospital number and ask for the Internal Medicine Resident On-Call. If you need medication refills, please notify your pharmacy one week in advance and they will send [place] a request.   Thank you for trusting me with your care. Wishing you the best!   Thea Alken, MS3

## 2023-09-15 NOTE — Assessment & Plan Note (Addendum)
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 3 mm nodule in R upper lung considered low-risk for malignancy, requiring no routine follow up. HRCT showed no evidence of ILD.

## 2023-09-15 NOTE — Assessment & Plan Note (Signed)
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 She reports drinking 3-4 L of water each day and was thinking of changing one of those bottles to include an electrolyte supplement. Is not symptomatic at this time. Counseled for neurological symptoms for which to seek care.  - Continue to monitor

## 2023-09-15 NOTE — Assessment & Plan Note (Addendum)
History of osteoporosis treated by aldenoate for 5 years, followed by a 5 year drug holiday. Previous DEXA was performed in [place] over a decade ago. DEXA on 09/16/2022 showed a T-score of -4.6. Without her previous DEXA, unclear if this is stable or worsening. Ordered repeat DEXA for comparison prior to restarting bisphosphonate therapy. She does not have any dental work planned and experienced no side effects with her previous course. She has never had any fractures. - Repeat DEXA within 1 month - Follow up in 3 months

## 2023-09-15 NOTE — Progress Notes (Signed)
Subjective:   April Howell is a 77 y.o. female who presents for an Initial Medicare Annual Wellness Visit.  Visit Complete: In person  Patient Medicare AWV questionnaire was completed by the patient on 09/15/2023; I have confirmed that all information answered by patient is correct and no changes since this date.        Objective:    Today's Vitals   09/15/23 1427  BP: (!) 129/56  Pulse: 91  Temp: 97.7 F (36.5 C)  TempSrc: Oral  SpO2: 100%  Weight: 127 lb 3.2 oz (57.7 kg)  Height: 5\' 9"  (1.753 m)   Body mass index is 18.78 kg/m.     09/15/2023    2:29 PM  Advanced Directives  Does Patient Have a Medical Advance Directive? Yes  Type of Advance Directive Healthcare Power of Attorney  Does patient want to make changes to medical advance directive? No - Patient declined  Copy of Healthcare Power of Attorney in Chart? Yes - validated most recent copy scanned in chart (See row information)    Current Medications (verified) Outpatient Encounter Medications as of 09/15/2023  Medication Sig   amoxicillin-clavulanate (AUGMENTIN) 875-125 MG tablet Take 1 tablet by mouth 2 (two) times daily. (Patient not taking: Reported on 08/19/2023)   CALCIUM PO Take 650 mg by mouth daily.   ferrous sulfate 325 (65 FE) MG EC tablet Take 325 mg by mouth daily with breakfast.   hydroxychloroquine (PLAQUENIL) 200 MG tablet TAKE 1 TABLET BY MOUTH TWICE A DAY MONDAY THROUGH FRIDAY   ipratropium-albuterol (DUONEB) 0.5-2.5 (3) MG/3ML SOLN Take 3 mLs by nebulization every 6 (six) hours as needed. (Patient not taking: Reported on 08/19/2023)   Multiple Vitamin (MULTIVITAMIN) tablet Take 1 tablet by mouth daily.   Omega-3 Fatty Acids (FISH OIL) 1000 MG CAPS Take by mouth.   No facility-administered encounter medications on file as of 09/15/2023.    Allergies (verified) Patient has no known allergies.   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    Past Surgical History:  Procedure Laterality Date   CATARACT EXTRACTION, BILATERAL Bilateral    05/06/2022 and 05/27/2022   MASTECTOMY Right    Family History  Problem Relation Age of Onset   Multiple sclerosis Mother    Heart attack Father    Heart disease Father    Hodgkin's lymphoma Sister    Breast cancer Sister 21   Basal cell carcinoma Sister    Healthy Son    Social History   Socioeconomic History   Marital status: Married    Spouse name: Not on file   Number of children: Not on file   Years of education: Not on file   Highest education level: Not on file  Occupational History   Not on file  Tobacco Use   Smoking status: Former    Current packs/day: 0.00    Average packs/day: 1 pack/day for 4.0 years (4.0 ttl pk-yrs)    Types: Cigarettes    Start date: 60    Quit date: 3    Years since quitting: 52.9    Passive exposure: Past   Smokeless tobacco: Never  Vaping Use   Vaping status: Never Used  Substance and Sexual Activity   Alcohol use: Yes  Alcohol/week: 7.0 standard drinks of alcohol    Types: 7 Glasses of wine per week   Drug use: Never   Sexual activity: Yes    Partners: Male  Other Topics Concern   Not on file  Social History Narrative   Not on file   Social Determinants of Health   Financial Resource Strain: Low Risk  (09/15/2023)   Overall Financial Resource Strain (CARDIA)    Difficulty of Paying Living Expenses: Not hard at all  Food Insecurity: No Food Insecurity (09/15/2023)   Hunger Vital Sign    Worried About Running Out of Food in the Last Year: Never true    Ran Out of Food in the Last Year: Never true  Transportation Needs: No Transportation Needs (09/15/2023)   PRAPARE - Administrator, Civil Service (Medical): No    Lack of Transportation (Non-Medical): No   Physical Activity: Insufficiently Active (09/15/2023)   Exercise Vital Sign    Days of Exercise per Week: 3 days    Minutes of Exercise per Session: 20 min  Stress: Stress Concern Present (09/15/2023)   [employer] of Occupational Health - Occupational Stress Questionnaire    Feeling of Stress : To some extent  Social Connections: Moderately Isolated (09/15/2023)   Social Connection and Isolation Panel [NHANES]    Frequency of Communication with Friends and Family: Twice a week    Frequency of Social Gatherings with Friends and Family: Three times a week    Attends Religious Services: Never    Active Member of Clubs or Organizations: No    Attends Engineer, structural: Never    Marital Status: Married    Tobacco Counseling Counseling given: Not Answered   Clinical Intake:  Pre-visit preparation completed: Yes  Pain : No/denies pain     Nutritional Risks: None Diabetes: No  How often do you need to have someone help you when you read instructions, pamphlets, or other written materials from your doctor or pharmacy?: 1 - Never  Interpreter Needed?: No  Information entered by :: Sonda Coppens,cma   Activities of Daily Living     No data to display          Patient Care Team: Morene Crocker, MD as PCP - General (Student)  Indicate any recent Medical Services you may have received from other than [facility] providers in the past year (date may be approximate).     Assessment:   This is a routine wellness examination for [place].  Hearing/Vision screen No results found.   Goals Addressed   None   Depression Screen    09/15/2023    2:28 PM 09/15/2023    2:24 PM 12/19/2021    2:49 PM 06/14/2021    1:25 PM  PHQ 2/9 Scores  PHQ - 2 Score 0 0 0 0  PHQ- 9 Score 2 2      Fall Risk    09/15/2023    2:28 PM 12/19/2021    1:40 PM 06/14/2021    1:25 PM  Fall Risk   Falls in the past year? 0 0 0  Number falls in past yr: 0  0  Injury with  Fall? 0  0  Risk for fall due to : No Fall Risks    Follow up Falls evaluation completed;Falls prevention discussed Falls evaluation completed     MEDICARE RISK AT HOME: Medicare Risk at Home Any stairs in or around the home?: No If so, are there any without handrails?: No Home  free of loose throw rugs in walkways, pet beds, electrical cords, etc?: Yes Adequate lighting in your home to reduce risk of falls?: Yes Life alert?: No Use of a cane, walker or w/c?: No Grab bars in the bathroom?: No Shower chair or bench in shower?: No Elevated toilet seat or a handicapped toilet?: No  TIMED UP AND GO:  Was the test performed? No    Cognitive Function:        Immunizations Immunization History  Administered Date(s) Administered   PFIZER(Purple Top)SARS-COV-2 Vaccination 11/12/2019, 12/03/2019, 07/19/2020, 02/14/2021    TDAP status: Due, Education has been provided regarding the importance of this vaccine. Advised may receive this vaccine at local pharmacy or Health Dept. Aware to provide a copy of the vaccination record if obtained from local pharmacy or Health Dept. Verbalized acceptance and understanding.  Flu Vaccine status: Due, Education has been provided regarding the importance of this vaccine. Advised may receive this vaccine at local pharmacy or Health Dept. Aware to provide a copy of the vaccination record if obtained from local pharmacy or Health Dept. Verbalized acceptance and understanding.  Pneumococcal vaccine status: Due, Education has been provided regarding the importance of this vaccine. Advised may receive this vaccine at local pharmacy or Health Dept. Aware to provide a copy of the vaccination record if obtained from local pharmacy or Health Dept. Verbalized acceptance and understanding.  Covid-19 vaccine status: Completed vaccines  Qualifies for Shingles Vaccine? No   Zostavax completed No   Shingrix Completed?: No.    Education has been provided regarding the  importance of this vaccine. Patient has been advised to call insurance company to determine out of pocket expense if they have not yet received this vaccine. Advised may also receive vaccine at local pharmacy or Health Dept. Verbalized acceptance and understanding.  Screening Tests Health Maintenance  Topic Date Due   DTaP/Tdap/Td (1 - Tdap) Never done   Zoster Vaccines- Shingrix (1 of 2) Never done   Pneumonia Vaccine 28+ Years old (1 of 1 - PCV) Never done   INFLUENZA VACCINE  Never done   COVID-19 Vaccine (5 - 2023-24 season) 06/22/2023   Medicare Annual Wellness (AWV)  09/14/2024   DEXA SCAN  Completed   Hepatitis C Screening  Completed   HPV VACCINES  Aged Out   Colonoscopy  Discontinued    Health Maintenance  Health Maintenance Due  Topic Date Due   DTaP/Tdap/Td (1 - Tdap) Never done   Zoster Vaccines- Shingrix (1 of 2) Never done   Pneumonia Vaccine 36+ Years old (1 of 1 - PCV) Never done   INFLUENZA VACCINE  Never done   COVID-19 Vaccine (5 - 2023-24 season) 06/22/2023    Colorectal cancer screening: Type of screening: Colonoscopy. Completed 11/22/2014. Repeat every 0 years    Bone Density status: Ordered 09/15/2023. Pt provided with contact info and advised to call to schedule appt.  Lung Cancer Screening: (Low Dose CT Chest recommended if Age 42-80 years, 20 pack-year currently smoking OR have quit w/in 15years.) does not qualify.   Lung Cancer Screening Referral: N/A  Additional Screening:  Hepatitis C Screening: does not qualify; Completed 10/10/2021  Vision Screening: Recommended annual ophthalmology exams for early detection of glaucoma and other disorders of the eye. Is the patient up to date with their annual eye exam?  Yes  Who is the provider or what is the name of the office in which the patient attends annual eye exams? Dr.Ablott If pt is not established with  a provider, would they like to be referred to a provider to establish care? No .   Dental  Screening: Recommended annual dental exams for proper oral hygiene   Community Resource Referral / Chronic Care Management: CRR required this visit?  No   CCM required this visit?  No     Plan:     I have personally reviewed and noted the following in the patient's chart:   Medical and social history Use of alcohol, tobacco or illicit drugs  Current medications and supplements including opioid prescriptions. Patient is not currently taking opioid prescriptions. Functional ability and status Nutritional status Physical activity Advanced directives List of other physicians Hospitalizations, surgeries, and ER visits in previous 12 months Vitals Screenings to include cognitive, depression, and falls Referrals and appointments  In addition, I have reviewed and discussed with patient certain preventive protocols, quality metrics, and best practice recommendations. A written personalized care plan for preventive services as well as general preventive health recommendations were provided to patient.     Cala Bradford, CMA   09/15/2023   After Visit Summary: (MyChart) Due to this being a telephonic visit, the after visit summary with patients personalized plan was offered to patient via MyChart   Nurse Notes: Face-To-Face Visit  Ms. Furbush , Thank you for taking time to come for your Medicare Wellness Visit. I appreciate your ongoing commitment to your health goals. Please review the following plan we discussed and let me know if I can assist you in the future.   These are the goals we discussed:  Goals   None     This is a list of the screening recommended for you and due dates:  Health Maintenance  Topic Date Due   DTaP/Tdap/Td vaccine (1 - Tdap) Never done   Zoster (Shingles) Vaccine (1 of 2) Never done   Pneumonia Vaccine (1 of 1 - PCV) Never done   Flu Shot  Never done   COVID-19 Vaccine (5 - 2023-24 season) 06/22/2023   Medicare Annual Wellness Visit  09/14/2024    DEXA scan (bone density measurement)  Completed   Hepatitis C Screening  Completed   HPV Vaccine  Aged Out   Colon Cancer Screening  Discontinued       I reviewed the AWV findings with the provider who conducted the visit. I was present in the office suite and immediately available to provide assistance and direction throughout the time the service was provided.   Morene Crocker, MD [facility] Internal Medicine Program - PGY-2 09/17/2023, 3:58 PM

## 2023-09-16 ENCOUNTER — Encounter: Payer: Self-pay | Admitting: Student

## 2023-09-16 NOTE — Progress Notes (Signed)
Internal Medicine Clinic Attending  Case discussed with the resident at the time of the visit.  We reviewed the resident's history and exam and pertinent patient test results.  I agree with the assessment, diagnosis, and plan of care documented in the resident's note.  

## 2023-09-17 NOTE — Addendum Note (Signed)
Addended by: Morene Crocker on: 09/17/2023 03:59 PM   Modules accepted: Level of Service

## 2023-09-17 NOTE — Addendum Note (Signed)
Addended by: Daiva Eves, Byrd Hesselbach on: 09/17/2023 04:00 PM   Modules accepted: Level of Service

## 2023-09-21 ENCOUNTER — Other Ambulatory Visit: Payer: Self-pay | Admitting: Physician Assistant

## 2023-09-22 ENCOUNTER — Encounter: Payer: Self-pay | Admitting: Student

## 2023-09-22 NOTE — Telephone Encounter (Signed)
Last Fill: 07/03/2023  Eye exam:  05/27/2023 WNL    Labs: 08/19/2023 Absolute lymphocytes are borderline low. RBC count is borderline low but has improved. Rest of CBC WNL. Sodium and chloride remain low  Next Visit: 01/20/2024  Last Visit: 08/19/2023  VW:[id number] syndrome with other organ involvement   Current Dose per office note 08/19/2023: Plaquenil 200 mg 1 tablet by mouth twice daily Monday through Friday   Okay to refill Plaquenil?

## 2023-09-24 NOTE — Progress Notes (Signed)
Internal Medicine Clinic Attending  Case discussed with the resident at the time of the visit.  We reviewed the resident's history and exam and pertinent patient test results.  I agree with the assessment, diagnosis, and plan of care documented in the resident's note.  

## 2023-10-12 IMAGING — MG MM BREAST BX W LOC DEV 1ST LESION IMAGE BX SPEC STEREO GUIDE*L*
6 of 9 series · 6 of 25 positions shown · non-contrast
Comparison: Previous exams.
COMPARISON: Previous exams.

Addendum:
CLINICAL DATA: 4 mm group of indeterminate calcifications in the
upper outer left breast at recent mammography.

EXAM:
LEFT BREAST STEREOTACTIC CORE NEEDLE BIOPSY

[L (1 of 2)]
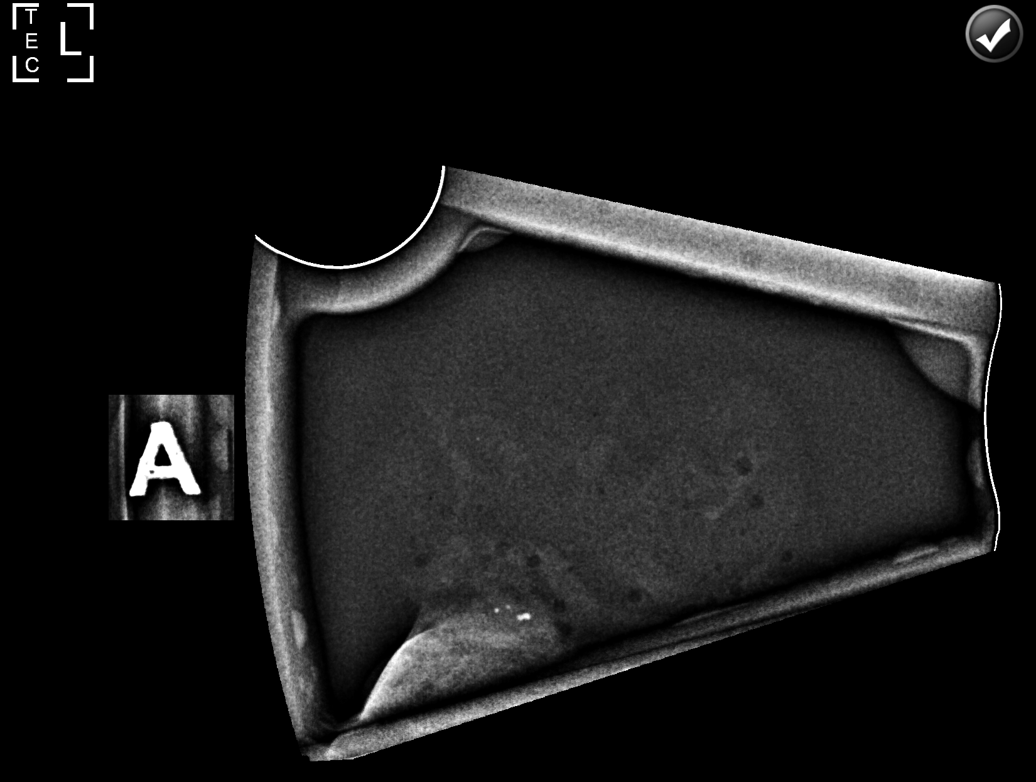

[L (2 of 2)]
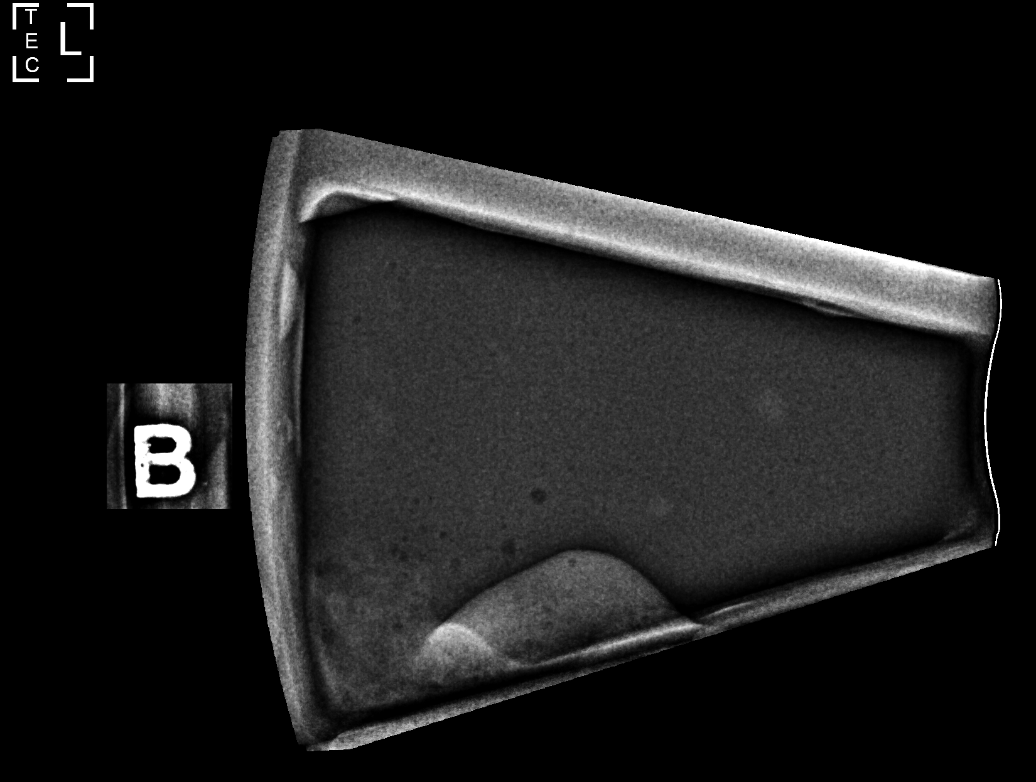

[L CC (1 of 3)]
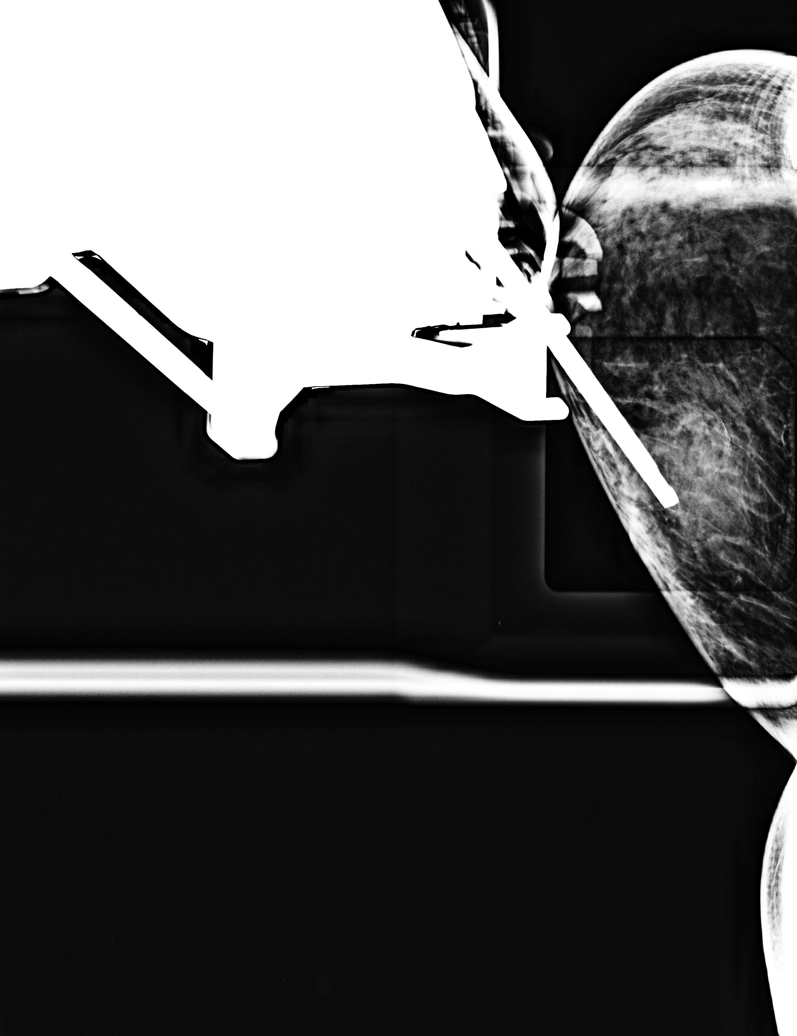

[L CC (2 of 3)]
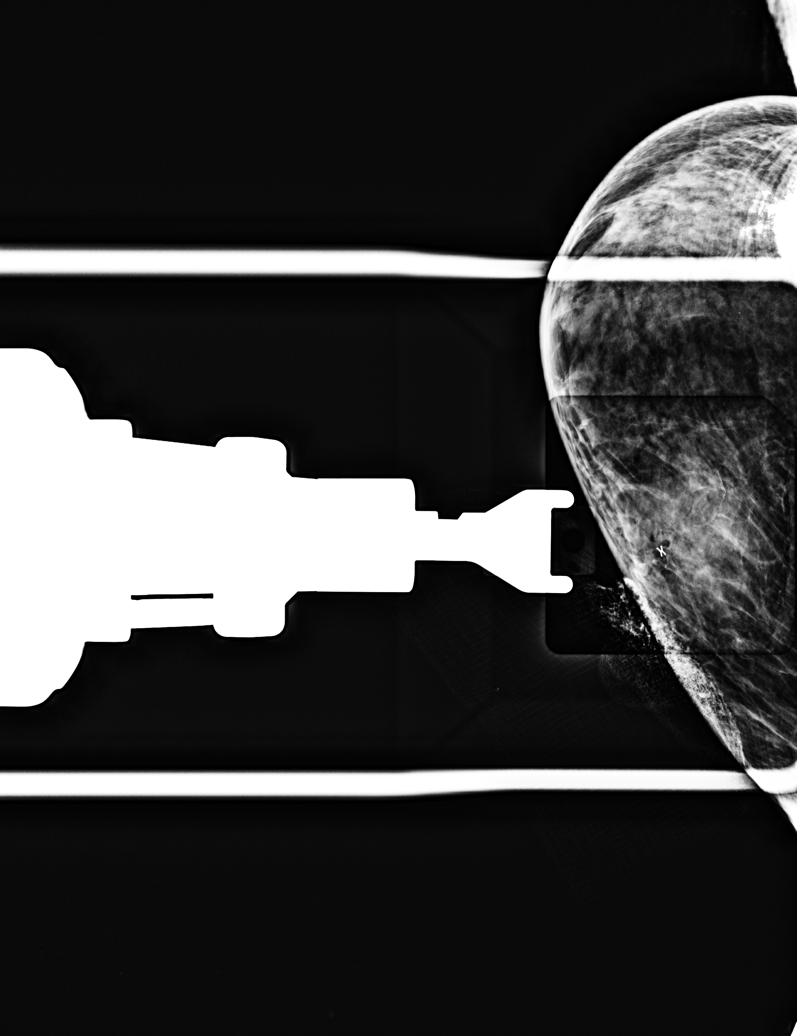

[L CC (3 of 3)]
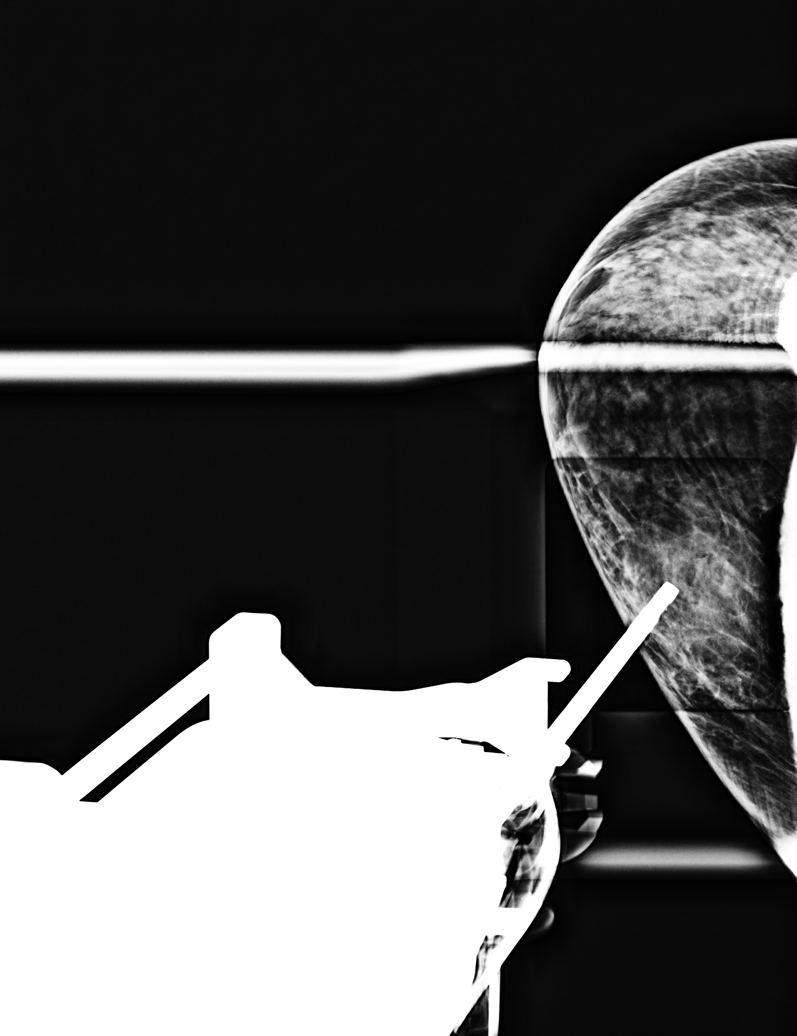

[L CC tomo · tomo slice 15/28.0]
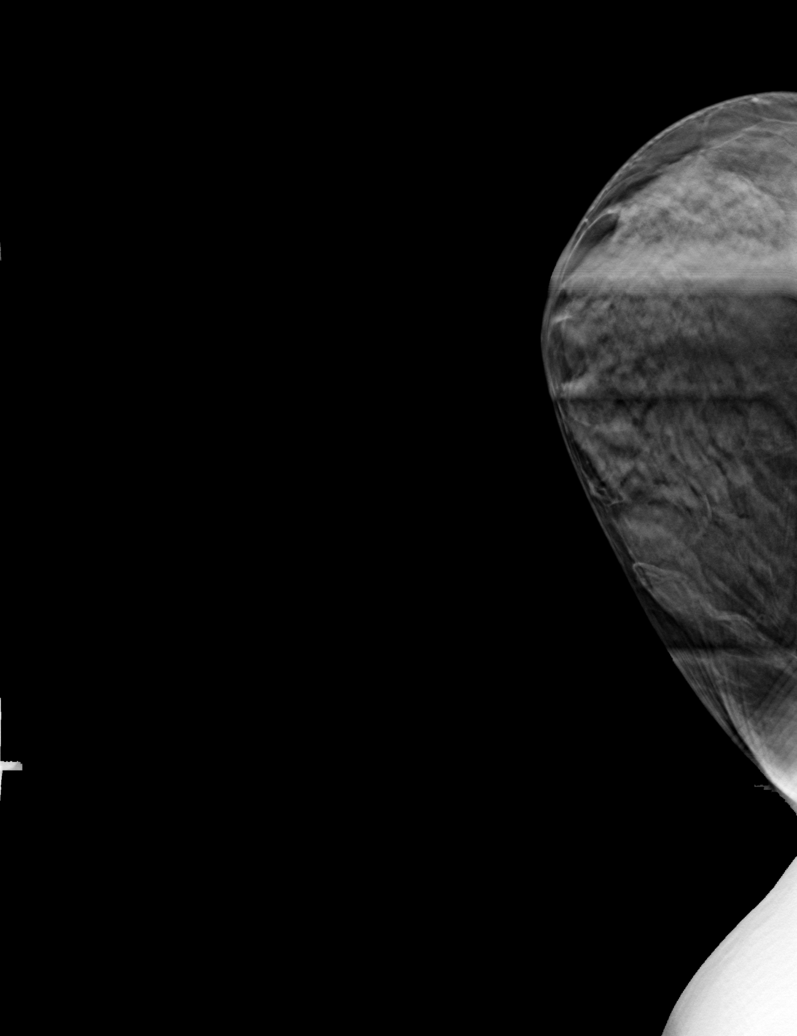

[6 of 25 positions shown; findings below may reference images not displayed]



Using sterile technique and 1% Lidocaine as local anesthetic, under
stereotactic guidance, a 9 gauge vacuum assisted device was used to
perform core needle biopsy of the recently demonstrated 4 mm group
of calcifications in the upper-outer left breast using a cephalad
approach. Specimen radiograph was performed showing . Specimens with
calcifications are identified for pathology.

Lesion quadrant: Upper outer quadrant

At the conclusion of the procedure, an X shaped tissue marker clip
was deployed into the biopsy cavity. Follow-up 2-view mammogram was
performed and dictated separately.
IMPRESSION: Stereotactic-guided biopsy of a 4 mm group calcifications in the
upper-outer left breast. No apparent complications.

ADDENDUM:
Pathology revealed BENIGN BREAST TISSUE WITH DUCTAL CALCIFICATIONS
AND STROMAL FIBROSIS- NEGATIVE FOR MALIGNANCY of the LEFT breast,
upper outer quadrant (X clip). This was found to be concordant by
Dr. Klever Jumper.

Pathology results were discussed with the patient by telephone with
Kaki Jim RN Nurse Navigator. The patient reported doing well
after the biopsy with tenderness at the site. Post biopsy
instructions and care were reviewed and questions were answered. The
patient was encouraged to call [REDACTED] for any additional concerns.

Recommendation: annual screening mammography (LEFT) due December 2022.

Pathology results reported by J Sus Riche RN on 01/30/2022.



Using sterile technique and 1% Lidocaine as local anesthetic, under
stereotactic guidance, a 9 gauge vacuum assisted device was used to
perform core needle biopsy of the recently demonstrated 4 mm group
of calcifications in the upper-outer left breast using a cephalad
approach. Specimen radiograph was performed showing . Specimens with
calcifications are identified for pathology.

Lesion quadrant: Upper outer quadrant

At the conclusion of the procedure, an X shaped tissue marker clip
was deployed into the biopsy cavity. Follow-up 2-view mammogram was
performed and dictated separately.
IMPRESSION: Stereotactic-guided biopsy of a 4 mm group calcifications in the
upper-outer left breast. No apparent complications.

## 2023-10-28 ENCOUNTER — Ambulatory Visit: Payer: Medicare Other | Admitting: Pulmonary Disease

## 2023-11-25 ENCOUNTER — Ambulatory Visit (INDEPENDENT_AMBULATORY_CARE_PROVIDER_SITE_OTHER): Payer: Medicare Other | Admitting: Pulmonary Disease

## 2023-11-25 ENCOUNTER — Encounter: Payer: Self-pay | Admitting: Pulmonary Disease

## 2023-11-25 VITALS — BP 110/70 | HR 74 | Ht 69.0 in | Wt 127.6 lb

## 2023-11-25 DIAGNOSIS — M3500 Sicca syndrome, unspecified: Secondary | ICD-10-CM | POA: Diagnosis not present

## 2023-11-25 DIAGNOSIS — J479 Bronchiectasis, uncomplicated: Secondary | ICD-10-CM

## 2023-11-25 NOTE — Progress Notes (Signed)
 Synopsis: Referred in October 2022 for Mycobacterial Infection by Debby Molt, MD  Subjective:   PATIENT ID: April Howell, April Howell  HPI  Chief Complaint  Patient presents with   Follow-up    Pt is having cough w/ phlegm (green color) x2 mths     April Howell is a 78 year old, former smoker who is returns to pulmonary clinic for bronchiectasis and sjogren's syndrome.   Initial OV 08/09/2021 She reports having a cough since May 2022 that is productive with greenish sputum intermittently. She is also experiencing fatigue/lack of energy and has been losing weight. The cough occurs when lying flat. She has no night time awakenings due to cough. She denies wheezing. Prior to May she denies issues with cough. She may experience cough with eating certain dry foods but this is not very common occurrence.   She denies history of frequent sinus or respiratory infections. She denies joint aches or skin rashes. She does complain of dry eyes and dry mouth. She does have GERD symptoms occasionally. She complains of easy bruising. She was recently evaluated by Dr. Federico for elevated total protein, note reviewed from 06/22/21. Her immunoglobulin levels were not low from this evaluation.   She is a former smoker and quit in 1972. She has history of breast cancer s/p right mastectomy and chemotherapy. She did not have radiation therapy.   OV 06/27/23 She continues to have productive cough on a daily basis. She has not been using flutter valve or nebulizer treatments. She denies much shortness of breath or wheezing. Her weight has been stable. She is following with Dr. Dolphus for Sjogren's syndrome. 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 She has significant fatigue.   OV 11/25/23 She completed a two-week course of Augmentin , which resulted in no mucus or symptoms for several months. However, about two to three months ago, she noticed a recurrence of symptoms,  though not as severe as before. She finds that using saline spray in her nose helps alleviate the symptoms. She has not been using the flutter valve since her symptoms improved post-Augmentin  treatment but is considering restarting its use now that she has more time. She has not noticed any significant changes in her breathing, coughing, or mucus production that would necessitate restarting medication.  She is currently taking Plaquenil  for Sjogren's syndrome to manage inflammation, particularly for dry eyes and dry mouth. She inquires if Plaquenil  could have any effect on her bronchiectasis, but it is primarily for glandular inflammation.  No trouble with swelling in the legs.  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      Family History  Problem Relation Age of Onset   Multiple sclerosis Mother    Heart attack Father    Heart disease Father    Hodgkin's lymphoma Sister    Breast cancer Sister 26   Basal cell carcinoma Sister    Healthy Son      Social History   Socioeconomic History   Marital status: Married    Spouse name: Not on file   Number of children: Not on file   Years of education: Not on file   Highest education level: Not on file  Occupational History   Not  on file  Tobacco Use   Smoking status: Former    Current packs/day: 0.00    Average packs/day: 1 pack/day for 4.0 years (4.0 ttl pk-yrs)    Types: Cigarettes    Start date: 76    Quit date: 74    Years since quitting: 53.1    Passive exposure: Past   Smokeless tobacco: Never  Vaping Use   Vaping status: Never Used  Substance and Sexual Activity   Alcohol use: Yes    Alcohol/week: 7.0 standard drinks of alcohol    Types: 7 Glasses of wine per week   Drug use: Never   Sexual activity: Yes    Partners: Male  Other  Topics Concern   Not on file  Social History Narrative   Not on file   Social Drivers of Health   Financial Resource Strain: Low Risk  (09/15/2023)   Overall Financial Resource Strain (CARDIA)    Difficulty of Paying Living Expenses: Not hard at all  Food Insecurity: No Food Insecurity (09/15/2023)   Hunger Vital Sign    Worried About Running Out of Food in the Last Year: Never true    Ran Out of Food in the Last Year: Never true  Transportation Needs: No Transportation Needs (09/15/2023)   PRAPARE - Administrator, Civil Service (Medical): No    Lack of Transportation (Non-Medical): No  Physical Activity: Insufficiently Active (09/15/2023)   Exercise Vital Sign    Days of Exercise per Week: 3 days    Minutes of Exercise per Session: 20 min  Stress: Stress Concern Present (09/15/2023)   [employer] of Occupational Health - Occupational Stress Questionnaire    Feeling of Stress : To some extent  Social Connections: Moderately Isolated (09/15/2023)   Social Connection and Isolation Panel [NHANES]    Frequency of Communication with Friends and Family: Twice a week    Frequency of Social Gatherings with Friends and Family: Three times a week    Attends Religious Services: Never    Active Member of Clubs or Organizations: No    Attends Banker Meetings: Never    Marital Status: Married  Catering Manager Violence: Not At Risk (09/15/2023)   Humiliation, Afraid, Rape, and Kick questionnaire    Fear of Current or Ex-Partner: No    Emotionally Abused: No    Physically Abused: No    Sexually Abused: No     No Known Allergies   Outpatient Medications Prior to Visit  Medication Sig Dispense Refill   CALCIUM PO Take 650 mg by mouth daily.     ferrous sulfate 325 (65 FE) MG EC tablet Take 325 mg by mouth daily with breakfast.     hydroxychloroquine  (PLAQUENIL ) 200 MG tablet TAKE 1 TABLET BY MOUTH TWICE A DAY MONDAY THROUGH FRIDAY 120 tablet 0   Multiple  Vitamin (MULTIVITAMIN) tablet Take 1 tablet by mouth daily.     Omega-3 Fatty Acids (FISH OIL) 1000 MG CAPS Take by mouth.     amoxicillin -clavulanate (AUGMENTIN ) 875-125 MG tablet Take 1 tablet by mouth 2 (two) times daily. (Patient not taking: Reported on 08/19/2023) 28 tablet 0   ipratropium-albuterol  (DUONEB) 0.5-2.5 (3) MG/3ML SOLN Take 3 mLs by nebulization every 6 (six) hours as needed. (Patient not taking: Reported on 08/19/2023) 360 mL 3   No facility-administered medications prior to visit.    Review of Systems  Constitutional:  Negative for chills, fever, malaise/fatigue and weight loss.  HENT:  Negative for congestion, sinus  pain and sore throat.   Eyes: Negative.   Respiratory:  Positive for cough. Negative for hemoptysis, sputum production, shortness of breath and wheezing.   Cardiovascular:  Negative for chest pain, palpitations, orthopnea, claudication and leg swelling.  Gastrointestinal:  Negative for abdominal pain, heartburn, nausea and vomiting.  Genitourinary: Negative.   Musculoskeletal:  Negative for joint pain and myalgias.  Skin:  Negative for rash.  Neurological:  Negative for weakness.  Endo/Heme/Allergies: Negative.   Psychiatric/Behavioral: Negative.      Objective:   Vitals:   11/25/23 1452  BP: 110/70  Pulse: 74  SpO2: 100%  Weight: 127 lb 9.6 oz (57.9 kg)  Height: 5' 9 (1.753 m)    Physical Exam Constitutional:      General: She is not in acute distress.    Appearance: She is not ill-appearing.  HENT:     Head: Normocephalic and atraumatic.  Eyes:     General: No scleral icterus.    Conjunctiva/sclera: Conjunctivae normal.  Cardiovascular:     Rate and Rhythm: Normal rate and regular rhythm.     Pulses: Normal pulses.     Heart sounds: Normal heart sounds. No murmur heard. Pulmonary:     Effort: Pulmonary effort is normal.     Breath sounds: Normal breath sounds. No wheezing, rhonchi or rales.  Musculoskeletal:     Right lower leg:  No edema.     Left lower leg: No edema.  Skin:    General: Skin is warm and dry.  Neurological:     General: No focal deficit present.     Mental Status: She is alert.     CBC    Component Value Date/Time   WBC 3.9 08/19/2023 1512   RBC 3.65 (L) 08/19/2023 1512   HGB 11.9 08/19/2023 1512   HGB 12.0 04/07/2023 1430   HCT 36.0 08/19/2023 1512   PLT 265 08/19/2023 1512   PLT 224 04/07/2023 1430   MCV 98.6 08/19/2023 1512   MCH 32.6 08/19/2023 1512   MCHC 33.1 08/19/2023 1512   RDW 11.2 08/19/2023 1512   LYMPHSABS 0.7 04/07/2023 1430   MONOABS 0.3 04/07/2023 1430   EOSABS 31 08/19/2023 1512   BASOSABS 31 08/19/2023 1512      Latest Ref Rng & Units 08/19/2023    3:12 PM 04/07/2023    2:30 PM 03/06/2023    1:47 PM  BMP  Glucose 65 - 99 mg/dL 88  99  96   BUN 7 - 25 mg/dL 15  14  13    Creatinine 0.60 - 1.00 mg/dL 9.27  9.26  9.27   BUN/Creat Ratio 6 - 22 (calc) SEE NOTE:   SEE NOTE:   Sodium 135 - 146 mmol/L 127  127  128   Potassium 3.5 - 5.3 mmol/L 4.5  4.1  4.5   Chloride 98 - 110 mmol/L 91  93  91   CO2 20 - 32 mmol/L 28  29  30    Calcium 8.6 - 10.4 mg/dL 9.7  9.4  9.6    Chest imaging CT Chest 07/17/21 1. Right middle lobe, lingula and bilateral lower lobe bronchiectasis, peribronchovascular nodularity, mucoid impaction and scattered volume loss, findings typical of mycobacterium avium complex. This likely accounts for the questioned abnormality in the right upper lobe on recent chest radiograph. 2. 3 mm right solid pulmonary nodule within the upper lobe. If patient is low risk for malignancy, no routine follow-up imaging is recommended; if patient is high risk for malignancy, a  non-contrast Chest CT at 12 months is optional.  PFT:     No data to display          Labs: 06/22/21 IgG 3,701 IgM 83 IgA 768  Path:  Echo:  Heart Catheterization:    Assessment & Plan:   Bronchiectasis without complication (HCC)  Discussion: Haylin Camilli is a 78 year  old, former smoker who is returns to pulmonary clinic for bronchiectasis and sjogren's syndrome.   Bronchiectasis Stable/improved on recent CT scan. Patient reports significant improvement after Augmentin  course, with only mild recurrence of symptoms. Currently not using flutter valve or nebulizer treatments. -Continue current management. -Consider restarting flutter valve and/or nebulizer treatments if cough or mucus production increases. -No further CT follow-up needed unless symptoms change.  Sjogren's Syndrome Patient on Plaquenil  for glandular inflammation. No lung involvement noted. -Continue current management with Plaquenil .  Follow-up in 1 year. If cough or mucus production worsens, patient to contact office for potential virtual visit and antibiotic prescription.  Dorn Chill, MD [facility] Office: [phone]    Current Outpatient Medications:    CALCIUM PO, Take 650 mg by mouth daily., Disp: , Rfl:    ferrous sulfate 325 (65 FE) MG EC tablet, Take 325 mg by mouth daily with breakfast., Disp: , Rfl:    hydroxychloroquine  (PLAQUENIL ) 200 MG tablet, TAKE 1 TABLET BY MOUTH TWICE A DAY MONDAY THROUGH FRIDAY, Disp: 120 tablet, Rfl: 0   Multiple Vitamin (MULTIVITAMIN) tablet, Take 1 tablet by mouth daily., Disp: , Rfl:    Omega-3 Fatty Acids (FISH OIL) 1000 MG CAPS, Take by mouth., Disp: , Rfl:    amoxicillin -clavulanate (AUGMENTIN ) 875-125 MG tablet, Take 1 tablet by mouth 2 (two) times daily. (Patient not taking: Reported on 08/19/2023), Disp: 28 tablet, Rfl: 0   ipratropium-albuterol  (DUONEB) 0.5-2.5 (3) MG/3ML SOLN, Take 3 mLs by nebulization every 6 (six) hours as needed. (Patient not taking: Reported on 08/19/2023), Disp: 360 mL, Rfl: 3

## 2023-11-25 NOTE — Patient Instructions (Signed)
 Your CT Chest scan from October is stable to slight improved, we will continue to monitor your bronchiectasis symptoms moving forward  Use flutter valve and nebulizer treatments twice daily as needed for airway clearance  Follow up in 1 year, call sooner if needed

## 2023-12-07 ENCOUNTER — Encounter: Payer: Self-pay | Admitting: Pulmonary Disease

## 2023-12-14 ENCOUNTER — Other Ambulatory Visit: Payer: Self-pay | Admitting: Physician Assistant

## 2023-12-15 NOTE — Telephone Encounter (Signed)
 Last Fill: 09/22/2023  Eye exam: 05/27/2023 WNL    Labs: 08/19/2023 Absolute lymphocytes are borderline low. RBC count is borderline low but has improved. Rest of CBC WNL. Sodium and chloride remain low-please forward to PCP.ESR remains elevated but has improved. RF remains positive but titer is trending down. UA normal. Complements WNL SPEP normal   Next Visit: 01/20/2024  Last Visit: 08/19/2023  IH:[id number] syndrome with other organ involvement   Current Dose per office note on 08/19/2023: Plaquenil 200 mg 1 tablet by mouth twice daily Monday through Friday.   Okay to refill Plaquenil?

## 2024-01-07 ENCOUNTER — Other Ambulatory Visit: Payer: Self-pay | Admitting: *Deleted

## 2024-01-07 ENCOUNTER — Ambulatory Visit: Payer: Medicare Other | Admitting: Pulmonary Disease

## 2024-01-07 DIAGNOSIS — M3509 Sicca syndrome with other organ involvement: Secondary | ICD-10-CM

## 2024-01-07 DIAGNOSIS — R700 Elevated erythrocyte sedimentation rate: Secondary | ICD-10-CM

## 2024-01-07 DIAGNOSIS — R778 Other specified abnormalities of plasma proteins: Secondary | ICD-10-CM

## 2024-01-07 DIAGNOSIS — Z79899 Other long term (current) drug therapy: Secondary | ICD-10-CM

## 2024-01-07 NOTE — Progress Notes (Unsigned)
 Office Visit Note  Patient: April Howell             Date of Birth: Sep 30, 1946           MRN: [id number]             PCP: Morene Crocker, MD Referring: Morene Crocker,* Visit Date: 01/21/2024 Occupation: @GUAROCC @  Subjective:  Medication monitoring   History of Present Illness: April Howell is a 78 y.o. female with history of  sjogren's syndrome and osteoarthritis.  Patient remains on Plaquenil 200 mg 1 tablet by mouth twice daily Monday through Friday.  She is tolerating Plaquenil without any side effects and has not had any recent gaps in therapy.  Patient continues to have chronic sicca symptoms.  She follows up with optometry on a regular basis as well as her dentist every 6 months.  Patient states that she continues to have persistent fatigue despite sleeping well overnight overall.  Patient states that she has been grieving the loss of her husband since January 2025.  She denies any joint pain or joint swelling at this time.  She denies any swollen lymph nodes.  She denies any other new medical conditions.     Activities of Daily Living:  Patient reports morning stiffness for 0 minutes.   Patient Reports nocturnal pain.  Difficulty dressing/grooming: Denies Difficulty climbing stairs: Denies Difficulty getting out of chair: Denies Difficulty using hands for taps, buttons, cutlery, and/or writing: Denies  Review of Systems  Constitutional:  Positive for fatigue.  HENT:  Positive for mouth dryness and nose dryness. Negative for mouth sores.   Eyes:  Positive for dryness. Negative for pain.  Respiratory:  Negative for shortness of breath and difficulty breathing.   Cardiovascular:  Negative for chest pain and palpitations.  Gastrointestinal:  Negative for blood in stool, constipation and diarrhea.  Endocrine: Negative for increased urination.  Genitourinary:  Negative for involuntary urination.  Musculoskeletal:  Positive for myalgias and myalgias. Negative  for joint pain, gait problem, joint pain, joint swelling, muscle weakness, morning stiffness and muscle tenderness.  Skin:  Positive for sensitivity to sunlight. Negative for color change, rash and hair loss.  Allergic/Immunologic: Negative for susceptible to infections.  Neurological:  Negative for dizziness and headaches.  Hematological:  Negative for swollen glands.  Psychiatric/Behavioral:  Negative for depressed mood and sleep disturbance. The patient is not nervous/anxious.     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     Family History  Problem Relation Age of Onset   Multiple sclerosis Mother    Heart attack Father    Heart disease Father    Hodgkin's lymphoma Sister    Breast cancer Sister 63   Basal cell carcinoma Sister    Healthy Son  Past Surgical History:  Procedure Laterality Date   CATARACT EXTRACTION, BILATERAL Bilateral    05/06/2022 and 05/27/2022   MASTECTOMY Right    Social History   Social History Narrative   Not on file   Immunization History  Administered Date(s) Administered   PFIZER(Purple Top)SARS-COV-2 Vaccination 11/12/2019, 12/03/2019, 07/19/2020, 02/14/2021     Objective: Vital Signs: BP 122/71 (BP Location: Right Arm, Patient Position: Sitting, Cuff Size: Normal)   Pulse 73   Resp  15   Ht 5\' 9"  (1.753 m)   Wt 127 lb 6.4 oz (57.8 kg)   BMI 18.81 kg/m    Physical Exam Vitals and nursing note reviewed.  Constitutional:      Appearance: She is well-developed.  HENT:     Head: Normocephalic and atraumatic.  Eyes:     Conjunctiva/sclera: Conjunctivae normal.  Cardiovascular:     Rate and Rhythm: Normal rate and regular rhythm.     Heart sounds: Normal heart sounds.  Pulmonary:     Effort: Pulmonary effort is normal.     Breath sounds: Normal breath sounds.  Abdominal:     General: Bowel sounds are normal.     Palpations: Abdomen is soft.  Musculoskeletal:     Cervical back: Normal range of motion.  Lymphadenopathy:     Cervical: No cervical adenopathy.  Skin:    General: Skin is warm and dry.     Capillary Refill: Capillary refill takes less than 2 seconds.  Neurological:     Mental Status: She is alert and oriented to person, place, and time.  Psychiatric:        Behavior: Behavior normal.      Musculoskeletal Exam: C-spine, thoracic spine, and lumbar spine good ROM.  Shoulder joints, elbow joints, wrist joints, MCPs, PIPs, and DIPs good ROM with no synovitis. Complete fist formation bilaterally.  Hip joints have good ROM with no groin pain.  Knee joints have good ROM with no warmth or effusion.  Ankle joints have good ROM with no tenderness or joint swelling.   CDAI Exam: CDAI Score: -- Patient Global: --; Provider Global: -- Swollen: --; Tender: -- Joint Exam 01/21/2024   No joint exam has been documented for this visit   There is currently no information documented on the homunculus. Go to the Rheumatology activity and complete the homunculus joint exam.  Investigation: No additional findings.  Imaging: No results found.  Recent Labs: Lab Results  Component Value Date   WBC 3.3 (L) 01/07/2024   HGB 12.2 01/07/2024   PLT 245 01/07/2024   NA 127 (L) 01/07/2024   K 4.8 01/07/2024   CL 92 (L) 01/07/2024   CO2 31 01/07/2024   GLUCOSE  94 01/07/2024   BUN 16 01/07/2024   CREATININE 0.78 01/07/2024   BILITOT 0.6 01/07/2024   ALKPHOS 49 04/07/2023   AST 25 01/07/2024   ALT 18 01/07/2024   PROT 8.2 (H) 01/07/2024   PROT 8.3 (H) 01/07/2024   ALBUMIN 3.9 04/07/2023   CALCIUM 9.7 01/07/2024   QFTBGOLDPLUS NEGATIVE 10/10/2021    Speciality Comments: PLQ Eye Exam: 06/25/2024 WNL @ [facility] Follow up in 6 months PLQ started 02/02/20223  Procedures:  No procedures performed Allergies: Patient has no known allergies.     Assessment / Plan:     Visit Diagnoses: Sjogren's syndrome with other organ involvement (HCC) - Positive ANA, positive Ro, positive La, positive RF, sicca symptoms (dry mouth, dry eyes, dry skin, vaginal dryness): Patient continues to  have chronic sicca symptoms.  Discussed the use of over-the-counter products.  She has been seeing the dentist every 6 months and optometry at least on a yearly basis.  She has no cervical lymphadenopathy on examination today.  No parotid swelling or tenderness.  No synovitis on exam.  She continues to have chronic fatigue-discussed the importance of regular exercise.   Patient had a high-resolution chest CT on 07/31/2023 which did not reveal any evidence of fibrotic interstitial lung disease.  She will be following up with Dr. Francine Graven on a yearly basis.  No new or worsening pulmonary symptoms at this time. Lab work from 01/07/2024 was reviewed today in the office: Sed rate 48, complements within normal limits, SPEP did not reveal any abnormal protein bands, RF remains positive but titer continues to trend down. Patient will remain on Plaquenil as prescribed.  She was advised to notify us if she develops any new or worsening symptoms.  She will follow-up in the office in 5 months or sooner if needed. She will follow up in 5 months or sooner if needed.   High risk medication use - Plaquenil 200 mg 1 tablet by mouth twice daily Monday through Friday.  CBC and CMP updated on  01/07/24.   PLQ Eye Exam: 06/26/2023 WNL @ [facility]   Elevated sed rate: ESR was 48 on 01/07/2024--continues to trend down.  No active inflammation noted on examination today.  Age-related osteoporosis without current pathological fracture - DEXA updated on 09/16/22: The BMD measured at Forearm Radius 33% is 0.469 g/cm2 with a T-score of -4.6.  She is taking a calcium and vitamin D supplement daily. DEXA due in November 2025.  Primary hypertension: Blood pressure was 122/71 today in the office.   Chronic fatigue: Patient continues to have chronic fatigue on a daily basis.  She has been sleeping well at night.  Discussed importance of regular exercise.  Bronchiectasis without complication (HCC): Followed by Dr. Francine Graven.   High-resolution chest CT updated on 07/31/2023: No evidence of fibrotic interstitial lung disease.  Cylindrical bronchiectasis, mucoid impaction, volume loss, and scattered nodularity mid and lower lung zone predominant, overall improved from 07/17/2021. Reviewed Dr. Lanora Manis office visit note from 11/25/2023: Stable/improved on recent CT.  Can consider restarting flutter valve or nebulizer treatments if cough or mucus production increases.  No further CT follow-up needed at this time.  Plan to follow-up on a yearly basis.  Other medical conditions are listed as follows:   Senile purpura (HCC)  Nodule of upper lobe of right lung  Abnormal SPEP: SPEP updated--no abnormal protein bands noted on 01/07/2024.  Other iron deficiency anemia  History of breast cancer  Purpura (HCC)  Family history of multiple sclerosis  Orders: No orders of the defined types were placed in this encounter.  No orders of the defined types were placed in this encounter.   Follow-Up Instructions: Return in about 5 months (around 06/22/2024) for Sjogren's syndrome.   Gearldine Bienenstock, PA-C  Note - This record has been created using Dragon software.  Chart creation errors have been  sought, but may not always  have been located. Such creation errors do not reflect on  the standard of medical care.

## 2024-01-08 NOTE — Progress Notes (Signed)
 White cell count is low most likely due to hydroxychloroquine use.  Sodium and chloride remains low.  Rheumatoid factor remains positive.  Sedimentation rate remains mildly elevated.  UA negative, complements and SPEP are pending.  Please forward results to her PCP.

## 2024-01-08 NOTE — Progress Notes (Signed)
 Complements are normal.

## 2024-01-09 LAB — URINALYSIS, ROUTINE W REFLEX MICROSCOPIC
Bilirubin Urine: NEGATIVE
Glucose, UA: NEGATIVE
Hgb urine dipstick: NEGATIVE
Ketones, ur: NEGATIVE
Nitrite: NEGATIVE
Protein, ur: NEGATIVE
Specific Gravity, Urine: 1.012 (ref 1.001–1.035)
pH: 6.5 (ref 5.0–8.0)

## 2024-01-09 LAB — COMPLETE METABOLIC PANEL WITH GFR
AG Ratio: 1.2 (calc) (ref 1.0–2.5)
ALT: 18 U/L (ref 6–29)
AST: 25 U/L (ref 10–35)
Albumin: 4.4 g/dL (ref 3.6–5.1)
Alkaline phosphatase (APISO): 53 U/L (ref 37–153)
BUN: 16 mg/dL (ref 7–25)
CO2: 31 mmol/L (ref 20–32)
Calcium: 9.7 mg/dL (ref 8.6–10.4)
Chloride: 92 mmol/L — ABNORMAL LOW (ref 98–110)
Creat: 0.78 mg/dL (ref 0.60–1.00)
Globulin: 3.8 g/dL — ABNORMAL HIGH (ref 1.9–3.7)
Glucose, Bld: 94 mg/dL (ref 65–99)
Potassium: 4.8 mmol/L (ref 3.5–5.3)
Sodium: 127 mmol/L — ABNORMAL LOW (ref 135–146)
Total Bilirubin: 0.6 mg/dL (ref 0.2–1.2)
Total Protein: 8.2 g/dL — ABNORMAL HIGH (ref 6.1–8.1)

## 2024-01-09 LAB — CBC WITH DIFFERENTIAL/PLATELET
Absolute Lymphocytes: 657 {cells}/uL — ABNORMAL LOW (ref 850–3900)
Absolute Monocytes: 300 {cells}/uL (ref 200–950)
Basophils Absolute: 30 {cells}/uL (ref 0–200)
Basophils Relative: 0.9 %
Eosinophils Absolute: 30 {cells}/uL (ref 15–500)
Eosinophils Relative: 0.9 %
HCT: 35.6 % (ref 35.0–45.0)
Hemoglobin: 12.2 g/dL (ref 11.7–15.5)
MCH: 33.4 pg — ABNORMAL HIGH (ref 27.0–33.0)
MCHC: 34.3 g/dL (ref 32.0–36.0)
MCV: 97.5 fL (ref 80.0–100.0)
MPV: 9.1 fL (ref 7.5–12.5)
Monocytes Relative: 9.1 %
Neutro Abs: 2284 {cells}/uL (ref 1500–7800)
Neutrophils Relative %: 69.2 %
Platelets: 245 10*3/uL (ref 140–400)
RBC: 3.65 10*6/uL — ABNORMAL LOW (ref 3.80–5.10)
RDW: 11.5 % (ref 11.0–15.0)
Total Lymphocyte: 19.9 %
WBC: 3.3 10*3/uL — ABNORMAL LOW (ref 3.8–10.8)

## 2024-01-09 LAB — PROTEIN ELECTROPHORESIS, SERUM, WITH REFLEX
Albumin ELP: 4.2 g/dL (ref 3.8–4.8)
Alpha 1: 0.2 g/dL (ref 0.2–0.3)
Alpha 2: 0.7 g/dL (ref 0.5–0.9)
Beta 2: 0.5 g/dL (ref 0.2–0.5)
Beta Globulin: 0.5 g/dL (ref 0.4–0.6)
Gamma Globulin: 2.2 g/dL — ABNORMAL HIGH (ref 0.8–1.7)
Total Protein: 8.3 g/dL — ABNORMAL HIGH (ref 6.1–8.1)

## 2024-01-09 LAB — RHEUMATOID FACTOR: Rheumatoid fact SerPl-aCnc: 96 [IU]/mL — ABNORMAL HIGH (ref <14)

## 2024-01-09 LAB — SEDIMENTATION RATE: Sed Rate: 48 mm/h — ABNORMAL HIGH (ref 0–30)

## 2024-01-09 LAB — MICROSCOPIC MESSAGE

## 2024-01-09 LAB — C3 AND C4
C3 Complement: 111 mg/dL (ref 83–193)
C4 Complement: 18 mg/dL (ref 15–57)

## 2024-01-11 NOTE — Progress Notes (Signed)
 SPEP  normal.

## 2024-01-20 ENCOUNTER — Ambulatory Visit: Payer: Medicare Other | Admitting: Rheumatology

## 2024-01-21 ENCOUNTER — Encounter: Payer: Self-pay | Admitting: Physician Assistant

## 2024-01-21 ENCOUNTER — Ambulatory Visit: Payer: Medicare Other | Attending: Physician Assistant | Admitting: Physician Assistant

## 2024-01-21 VITALS — BP 122/71 | HR 73 | Resp 15 | Ht 69.0 in | Wt 127.4 lb

## 2024-01-21 DIAGNOSIS — R911 Solitary pulmonary nodule: Secondary | ICD-10-CM | POA: Diagnosis present

## 2024-01-21 DIAGNOSIS — I10 Essential (primary) hypertension: Secondary | ICD-10-CM

## 2024-01-21 DIAGNOSIS — Z853 Personal history of malignant neoplasm of breast: Secondary | ICD-10-CM | POA: Diagnosis present

## 2024-01-21 DIAGNOSIS — R700 Elevated erythrocyte sedimentation rate: Secondary | ICD-10-CM

## 2024-01-21 DIAGNOSIS — R778 Other specified abnormalities of plasma proteins: Secondary | ICD-10-CM | POA: Diagnosis present

## 2024-01-21 DIAGNOSIS — R5382 Chronic fatigue, unspecified: Secondary | ICD-10-CM

## 2024-01-21 DIAGNOSIS — J479 Bronchiectasis, uncomplicated: Secondary | ICD-10-CM | POA: Diagnosis present

## 2024-01-21 DIAGNOSIS — D508 Other iron deficiency anemias: Secondary | ICD-10-CM

## 2024-01-21 DIAGNOSIS — D692 Other nonthrombocytopenic purpura: Secondary | ICD-10-CM | POA: Diagnosis present

## 2024-01-21 DIAGNOSIS — Z820 Family history of epilepsy and other diseases of the nervous system: Secondary | ICD-10-CM

## 2024-01-21 DIAGNOSIS — M3509 Sicca syndrome with other organ involvement: Secondary | ICD-10-CM | POA: Diagnosis present

## 2024-01-21 DIAGNOSIS — M810 Age-related osteoporosis without current pathological fracture: Secondary | ICD-10-CM

## 2024-01-21 DIAGNOSIS — Z79899 Other long term (current) drug therapy: Secondary | ICD-10-CM | POA: Diagnosis present

## 2024-01-28 ENCOUNTER — Ambulatory Visit: Payer: Medicare Other | Admitting: Student

## 2024-01-28 VITALS — BP 143/75 | HR 68 | Temp 97.7°F | Ht 69.0 in | Wt 126.6 lb

## 2024-01-28 DIAGNOSIS — M3500 Sicca syndrome, unspecified: Secondary | ICD-10-CM

## 2024-01-28 DIAGNOSIS — E871 Hypo-osmolality and hyponatremia: Secondary | ICD-10-CM | POA: Diagnosis not present

## 2024-01-28 DIAGNOSIS — M818 Other osteoporosis without current pathological fracture: Secondary | ICD-10-CM

## 2024-01-28 DIAGNOSIS — R5382 Chronic fatigue, unspecified: Secondary | ICD-10-CM

## 2024-01-28 DIAGNOSIS — F4321 Adjustment disorder with depressed mood: Secondary | ICD-10-CM

## 2024-01-28 DIAGNOSIS — C50919 Malignant neoplasm of unspecified site of unspecified female breast: Secondary | ICD-10-CM

## 2024-01-28 DIAGNOSIS — D72819 Decreased white blood cell count, unspecified: Secondary | ICD-10-CM

## 2024-01-28 DIAGNOSIS — I10 Essential (primary) hypertension: Secondary | ICD-10-CM | POA: Diagnosis not present

## 2024-01-28 NOTE — Progress Notes (Unsigned)
 Subjective:  CC: Follow up after death of husband  HPI:  [name] is a 78 y.o. female with a past medical history stated below and presents today for follow up on lab tests and check in after death of her husband. Please see problem based assessment and plan for additional details.  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     ferrous sulfate 325 (65 FE) MG EC tablet Take 325 mg by mouth daily with breakfast.     hydroxychloroquine (PLAQUENIL) 200 MG tablet TAKE 1 TABLET BY MOUTH TWICE A DAY MONDAY THROUGH FRIDAY 120 tablet 0   Multiple Vitamin (MULTIVITAMIN) tablet Take 1 tablet by mouth daily.     Omega-3 Fatty Acids (FISH OIL) 1000 MG CAPS Take by mouth.     No current facility-administered medications on file prior to visit.    Family History  Problem Relation Age of Onset   Multiple sclerosis Mother    Heart attack Father    Heart disease Father    Hodgkin's lymphoma Sister    Breast cancer Sister 42   Basal cell carcinoma Sister    Healthy Son     Social History   Socioeconomic History   Marital status: Married    Spouse name: Not on file   Number of children: Not on file   Years of education: Not on file   Highest education level: Not on file  Occupational History   Not on file  Tobacco Use   Smoking status: Former    Current packs/day: 0.00    Average packs/day: 1 pack/day for 4.0 years (4.0 ttl pk-yrs)    Types: Cigarettes    Start date: 46    Quit date: 58    Years since quitting: 53.3    Passive exposure: Past   Smokeless tobacco: Never  Vaping Use   Vaping status: Never Used  Substance and Sexual Activity   Alcohol  use: Yes    Alcohol/week: 7.0 standard drinks of alcohol    Types: 7 Glasses of wine per week   Drug use: Never   Sexual activity: Yes    Partners: Male  Other Topics Concern   Not on file  Social History Narrative   Not on file   Social Drivers of Health   Financial Resource Strain: Low Risk  (09/15/2023)   Overall Financial Resource Strain (CARDIA)    Difficulty of Paying Living Expenses: Not hard at all  Food Insecurity: No Food Insecurity (09/15/2023)   Hunger Vital Sign    Worried About Running Out of Food in the Last Year: Never true    Ran Out of Food in the Last Year: Never true  Transportation Needs: No Transportation Needs (09/15/2023)   PRAPARE - Administrator, Civil Service (Medical): No    Lack of Transportation (Non-Medical): No  Physical Activity: Insufficiently Active (09/15/2023)   Exercise Vital Sign    Days of Exercise per Week: 3 days    Minutes of Exercise per Session: 20 min  Stress: Stress Concern Present (09/15/2023)   [employer] of  Occupational Health - Occupational Stress Questionnaire    Feeling of Stress : To some extent  Social Connections: Moderately Isolated (09/15/2023)   Social Connection and Isolation Panel [NHANES]    Frequency of Communication with Friends and Family: Twice a week    Frequency of Social Gatherings with Friends and Family: Three times a week    Attends Religious Services: Never    Active Member of Clubs or Organizations: No    Attends Banker Meetings: Never    Marital Status: Married  Catering manager Violence: Not At Risk (09/15/2023)   Humiliation, Afraid, Rape, and Kick questionnaire    Fear of Current or Ex-Partner: No    Emotionally Abused: No    Physically Abused: No    Sexually Abused: No    Review of Systems: ROS negative except for what is noted on the assessment and plan.  Objective:   Vitals:   01/28/24 1506 01/28/24 1558  BP: (!) 154/68 (!) 143/75  Pulse: 76 68   Temp: 97.7 F (36.5 C)   TempSrc: Oral   SpO2: 100%   Weight: 126 lb 9.6 oz (57.4 kg)   Height: 5\' 9"  (1.753 m)     Physical Exam: Constitutional: well-appearing woman sitting in chair, in no acute distress HENT: normocephalic atraumatic, mucous membranes moist Eyes: conjunctiva non-erythematous Neck: supple Cardiovascular: regular rate and rhythm, no m/r/g Pulmonary/Chest: normal work of breathing on room air, lungs clear to auscultation bilaterally Abdominal: soft, non-tender, non-distended Neurological: alert & oriented x 3, moving all extremities, normal gait Skin: warm and dry Psych: Pleasant  mood and affect     01/29/2024    8:24 AM  Depression screen PHQ 2/9  Decreased Interest 0  Down, Depressed, Hopeless 0  PHQ - 2 Score 0  Altered sleeping 0  Tired, decreased energy 3  Change in appetite 0  Feeling bad or failure about yourself  0  Trouble concentrating 0  Moving slowly or fidgety/restless 0  Suicidal thoughts 0  PHQ-9 Score 3  Difficult doing work/chores Somewhat difficult       Assessment & Plan:   Primary hypertension Has been well controlled with diet and exercise. Patient reports no symptoms today. No changes in her level of activity or eating habits. Vitals:   01/28/24 1506 01/28/24 1558  BP: (!) 154/68 (!) 143/75   Reviewed recent office visits at specialist appointments and patient has had SBP <130. Discussed with patient close monitoring of blood pressures at home. If remains elevated at follow up, may need to consider the addition of antihypertensive medication.   Grief Discussed patient's transition to life without [name], a former fellow patient at [facility]. Patient expressed sadness with the loss of her husband, appreciation for having established advanced directives with him prior to his last hospitalization and the kindness of the doctors and nurses involved, and relief to have family members in [place] to support her through this period.  Though she is still trying to fill in her time with activities, she is opting for those that bring her joy.   She continues to care for self, attending her scheduled specialist appointments. We discussed activities to look forward to and proposed joint efforts to check in to identify prolonged gried, depression, or any other new symptoms in the upcoming months  Chronic fatigue Follows up with her rheumatologist. Improvement in ESR, rheumatoid factor, still positive.   Sjogren's syndrome (HCC) Stable on chronic Plaquenil 200 mg BID.  Leukopenia Chronic intermittent. Likely in setting  of plaquenil therapy. No infectious symptoms today - CTM  Hyponatremia Stable - Patient to monitor for neurologic symptom monitoring - Follow up at future OV  Osteoporosis Takes calcium and vit D. Prior Alendronate therapy in [place] (5 years ago) on prior drug holiday. DEXA due in November 2025.    Return in about 6 months (around 07/29/2024) for Hypertension and chronic disease follow up.  Patient discussed with Dr.  Carlisle Cater, MD [facility]  01/29/2024, 8:32 AM

## 2024-01-28 NOTE — Patient Instructions (Signed)
 Thank you, [name] for allowing [place] to provide your care today. It was my pleasure to see you. Today we discussed your overall health, transition to life without Mr. Sprowl, your recent rheumatologist appointment and labs, and next steps for your osteoporosis and breast cancer screening.  I encourage you to consider Aquatherapy at [facility] and any other activities that make you happy to stay connected with your family and surrounding communities.  Please follow-up in: ~6 months; please call the front office in August to schedule your next appointment with me.     We look forward to seeing you next time. Please call our clinic at [phone] if you have any questions or concerns. The best time to call is Monday-Friday from 9am-4pm, but there is someone available 24/7. If after hours or the weekend, call the main hospital number and ask for the Internal Medicine Resident On-Call. If you need medication refills, please notify your pharmacy one week in advance and they will send [place] a request.   Take care,  Morene Crocker, MD 01/28/2024, 3:51 PM [facility]

## 2024-01-29 DIAGNOSIS — F4321 Adjustment disorder with depressed mood: Secondary | ICD-10-CM | POA: Insufficient documentation

## 2024-01-29 DIAGNOSIS — D72819 Decreased white blood cell count, unspecified: Secondary | ICD-10-CM | POA: Insufficient documentation

## 2024-01-29 NOTE — Progress Notes (Signed)
 Internal Medicine Clinic Attending  Case discussed with the resident at the time of the visit.  We reviewed the resident's history and exam and pertinent patient test results.  I agree with the assessment, diagnosis, and plan of care documented in the resident's note.

## 2024-01-29 NOTE — Assessment & Plan Note (Signed)
 Has been well controlled with diet and exercise. Patient reports no symptoms today. No changes in her level of activity or eating habits. Vitals:   01/28/24 1506 01/28/24 1558  BP: (!) 154/68 (!) 143/75   Reviewed recent office visits at specialist appointments and patient has had SBP <130. Discussed with patient close monitoring of blood pressures at home. If remains elevated at follow up, may need to consider the addition of antihypertensive medication.

## 2024-01-29 NOTE — Assessment & Plan Note (Signed)
 Stable - Patient to monitor for neurologic symptom monitoring - Follow up at future OV

## 2024-01-29 NOTE — Assessment & Plan Note (Addendum)
 Takes calcium and vit D. Prior Alendronate therapy in [place] (5 years ago) on prior drug holiday. DEXA due in November 2025.

## 2024-01-29 NOTE — Assessment & Plan Note (Signed)
 Chronic intermittent. Likely in setting of plaquenil therapy. No infectious symptoms today - CTM

## 2024-01-29 NOTE — Assessment & Plan Note (Signed)
 Discussed patient's transition to life without April Howell, a former fellow patient at [facility]. Patient expressed sadness with the loss of her husband, appreciation for having established advanced directives with him prior to his last hospitalization and the kindness of the doctors and nurses involved, and relief to have family members in [place] to support her through this period. Though she is still trying to fill in her time with activities, she is opting for those that bring her joy.   She continues to care for self, attending her scheduled specialist appointments. We discussed activities to look forward to and proposed joint efforts to check in to identify prolonged gried, depression, or any other new symptoms in the upcoming months

## 2024-01-29 NOTE — Assessment & Plan Note (Signed)
 Follows up with her rheumatologist. Improvement in ESR, rheumatoid factor, still positive.

## 2024-01-29 NOTE — Assessment & Plan Note (Signed)
 Stable on chronic Plaquenil 200 mg BID.

## 2024-02-10 ENCOUNTER — Other Ambulatory Visit: Payer: Self-pay | Admitting: Internal Medicine

## 2024-02-10 DIAGNOSIS — Z1231 Encounter for screening mammogram for malignant neoplasm of breast: Secondary | ICD-10-CM

## 2024-03-13 ENCOUNTER — Other Ambulatory Visit: Payer: Self-pay | Admitting: Rheumatology

## 2024-03-22 ENCOUNTER — Ambulatory Visit
Admission: RE | Admit: 2024-03-22 | Discharge: 2024-03-22 | Disposition: A | Discharge status: Home / Self Care | Source: Ambulatory Visit | Attending: Internal Medicine | Admitting: Internal Medicine

## 2024-03-22 DIAGNOSIS — Z1231 Encounter for screening mammogram for malignant neoplasm of breast: Secondary | ICD-10-CM

## 2024-05-12 ENCOUNTER — Other Ambulatory Visit: Payer: Medicare Other

## 2024-06-04 ENCOUNTER — Other Ambulatory Visit: Payer: Self-pay | Admitting: Rheumatology

## 2024-06-04 NOTE — Telephone Encounter (Signed)
 Last Fill: 03/14/2024  Eye exam: 06/25/2024 WNL @ [facility]    Labs: 01/07/2024: White cell count is low most likely due to hydroxychloroquine  use.  Sodium and chloride remains low.  Rheumatoid factor remains positive.  Sedimentation rate remains mildly elevated.UA negative, complements and SPEP WNL.  Next Visit: 07/07/2024  Last Visit: 01/21/2024  IK:Dgnhmzw'd syndrome with other organ involvement  Current Dose per office note 01/21/2024 Plaquenil  200 mg 1 tablet by mouth twice daily Monday through Friday.   Okay to refill Plaquenil ?

## 2024-06-24 NOTE — Telephone Encounter (Signed)
 Spoke with the patient appt sch- Name: April Howell, April Howell MRN: [id number]  Date: 07/08/2024 Status: Sch  Time: 9:45 AM Length: 30  Visit Type: OPEN ESTABLISHED [726] Copay: $0.00  Provider: Elnora Ip, MD      Copied from CRM [id number]. Topic: Appointments - Appointment Scheduling >> Jun 22, 2024  3:48 PM April Howell wrote: Pt April Howell states she just want to see Dr Kristy and would like a call when her October schedule populates...  Please advise.

## 2024-06-25 NOTE — Progress Notes (Signed)
 Office Visit Note  Patient: April Howell             Date of Birth: 10/01/1946           MRN: [id number]             PCP: Elnora Ip, MD Referring: Elnora Ip,* Visit Date: 07/07/2024 Occupation: @GUAROCC @  Subjective:  Medication monitoring   History of Present Illness: April Howell is a 78 y.o. female with Sjogren syndrome, osteoporosis and osteoarthritis.  She returns today after her last visit in April 2025.  She continues to have dry mouth and dry eyes.  She is on Plaquenil  200 mg p.o. twice daily Monday to Friday which she has been tolerating well.  She states she is scheduled to have her first IV Reclast on July 14, 2024 by her PCP.  She has been taking calcium and vitamin D .  She stays active.  She denies any shortness of breath or lymphadenopathy    Activities of Daily Living:  Patient reports morning stiffness for 0 minutes.   Patient Denies nocturnal pain.  Difficulty dressing/grooming: Denies Difficulty climbing stairs: Denies Difficulty getting out of chair: Denies Difficulty using hands for taps, buttons, cutlery, and/or writing: Denies  Review of Systems  Constitutional:  Positive for fatigue.  HENT:  Positive for mouth dryness. Negative for mouth sores.   Eyes:  Positive for dryness.  Respiratory:  Negative for shortness of breath.   Cardiovascular:  Negative for chest pain and palpitations.  Gastrointestinal:  Negative for blood in stool, constipation and diarrhea.  Endocrine: Negative for increased urination.  Genitourinary:  Negative for involuntary urination.  Musculoskeletal:  Positive for muscle weakness. Negative for joint pain, gait problem, joint pain, joint swelling, myalgias, morning stiffness, muscle tenderness and myalgias.  Skin:  Positive for sensitivity to sunlight. Negative for color change, rash and hair loss.  Allergic/Immunologic: Negative for susceptible to infections.  Neurological:  Negative for dizziness  and headaches.  Hematological:  Negative for swollen glands.  Psychiatric/Behavioral:  Negative for depressed mood and sleep disturbance. The patient is not nervous/anxious.     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 06/15/2021   Nodule of upper lobe of right lung 06/15/2021   Purpura (HCC) 06/14/2021   Chronic fatigue 06/14/2021   Primary hypertension 06/14/2021   Abdominal bruit 06/14/2021   Elevated total protein 06/14/2021    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     Family History  Problem Relation Age of Onset   Multiple sclerosis Mother    Heart attack Father    Heart disease Father    Hodgkin's lymphoma Sister    Breast cancer Sister 39   Basal cell carcinoma Sister    Healthy Son    Past Surgical History:  Procedure Laterality Date   CATARACT EXTRACTION, BILATERAL Bilateral    05/06/2022 and 05/27/2022   MASTECTOMY Right    Social History   Social History Narrative   Not on file  Immunization History  Administered Date(s) Administered   PFIZER(Purple Top)SARS-COV-2 Vaccination 11/12/2019, 12/03/2019, 07/19/2020, 02/14/2021     Objective: Vital Signs: BP 129/71   Pulse 71   Temp (!) 97.3 F (36.3 C)   Resp 14   Ht 5' 9 (1.753 m)   Wt 119 lb 3.2 oz (54.1 kg)   BMI 17.60 kg/m    Physical Exam Vitals and nursing note reviewed.  Constitutional:      Appearance: She is well-developed.  HENT:     Head: Normocephalic and atraumatic.  Eyes:     Conjunctiva/sclera: Conjunctivae normal.  Cardiovascular:      Rate and Rhythm: Normal rate and regular rhythm.     Heart sounds: Normal heart sounds.  Pulmonary:     Effort: Pulmonary effort is normal.     Breath sounds: Normal breath sounds.  Abdominal:     General: Bowel sounds are normal.     Palpations: Abdomen is soft.  Musculoskeletal:     Cervical back: Normal range of motion.  Lymphadenopathy:     Cervical: No cervical adenopathy.  Skin:    General: Skin is warm and dry.     Capillary Refill: Capillary refill takes less than 2 seconds.  Neurological:     Mental Status: She is alert and oriented to person, place, and time.  Psychiatric:        Behavior: Behavior normal.      Musculoskeletal Exam: Cervical, thoracic and lumbar spine were in good range of motion.  There was no SI joint tenderness.  Shoulder joints, elbow joints, wrist joints, MCPs, PIPs and DIPs were in good range of motion with no synovitis.  Hip joints and knee joints were in good range of motion without any warmth swelling or effusion.  There was no tenderness over ankles or MTPs.   CDAI Exam: CDAI Score: -- Patient Global: --; Provider Global: -- Swollen: --; Tender: -- Joint Exam 07/07/2024   No joint exam has been documented for this visit   There is currently no information documented on the homunculus. Go to the Rheumatology activity and complete the homunculus joint exam.  Investigation: No additional findings.  Imaging: DG Bone Density Result Date: 07/02/2024 EXAM: DUAL X-RAY ABSORPTIOMETRY (DXA) FOR BONE MINERAL DENSITY 07/01/2024 2:12 pm CLINICAL DATA:  78 year old Female Postmenopausal. History of osteoporosis, assessing medication re-initiation Patient is or has been on bone building therapies. TECHNIQUE: An axial (e.g., hips, spine) and/or appendicular (e.g., radius) exam was performed, as appropriate, using GE Secretary/administrator at [employer]. Images are obtained for bone mineral density measurement and are not obtained  for diagnostic purposes. [id number] Exclusions: None. COMPARISON:  Current and prior studies are not comparable because of dissimilar scan types or analysis methods. FINDINGS: Scan quality: Good. LUMBAR SPINE (L1-L4): BMD (in g/cm2): 0.850 T-score: -2.8 Z-score: -0.7 LEFT FEMORAL NECK: BMD (in g/cm2): 0.553 T-score: -3.5 Z-score: -1.3 LEFT TOTAL HIP: BMD (in g/cm2): 0.559 T-score: -3.6 Z-score: -1.5 RIGHT FEMORAL NECK: BMD (in g/cm2): 0.623 T-score: -3.0 Z-score: -0.8 RIGHT TOTAL HIP: BMD (in g/cm2): 0.524 T-score: -3.8 Z-score: -1.8 LEFT FOREARM (RADIUS 33%): BMD (in g/cm2): 0.398 T-score: -5.5 Z-score: -3.0 FRAX 10-YEAR PROBABILITY OF FRACTURE: FRAX not reported as the lowest BMD is not in the osteopenia range. IMPRESSION: Osteoporosis based on BMD. Fracture risk is unknown due to history of bone building therapy. RECOMMENDATIONS: 1. All patients should optimize calcium and vitamin D  intake. 2. Consider FDA-approved medical therapies in postmenopausal women and men  aged 45 years and older, based on the following: - A hip or vertebral (clinical or morphometric) fracture - T-score less than or equal to -2.5 and secondary causes have been excluded. - Low bone mass (T-score between -1.0 and -2.5) and a 10-year probability of a hip fracture greater than or equal to 3% or a 10-year probability of a major osteoporosis-related fracture greater than or equal to 20% based on the [place] -adapted WHO algorithm. - Clinician judgment and/or patient preferences may indicate treatment for people with 10-year fracture probabilities above or below these levels 3. Patients with diagnosis of osteoporosis or at high risk for fracture should have regular bone mineral density tests. For patients eligible for Medicare, routine testing is allowed once every 2 years. The testing frequency can be increased to one year for patients who have rapidly progressing disease, those who are receiving or discontinuing medical therapy to restore bone mass,  or have additional risk factors. Electronically Signed   By: Dina  Arceo M.D.   On: 07/02/2024 13:09    Recent Labs: Lab Results  Component Value Date   WBC 3.6 (L) 06/30/2024   HGB 11.5 (L) 06/30/2024   PLT 242 06/30/2024   NA 126 (L) 06/30/2024   K 4.4 06/30/2024   CL 92 (L) 06/30/2024   CO2 29 06/30/2024   GLUCOSE 112 (H) 06/30/2024   BUN 14 06/30/2024   CREATININE 0.72 06/30/2024   BILITOT 0.5 06/30/2024   ALKPHOS 49 04/07/2023   AST 24 06/30/2024   ALT 15 06/30/2024   PROT 7.7 06/30/2024   PROT 7.6 06/30/2024   ALBUMIN 3.9 04/07/2023   CALCIUM 9.0 06/30/2024   QFTBGOLDPLUS NEGATIVE 10/10/2021    Speciality Comments: PLQ Eye Exam: 06/23/2024 WNL @ [facility] Follow up in 6 months PLQ started 02/02/20223  Procedures:  No procedures performed Allergies: Patient has no known allergies.   Assessment / Plan:     Visit Diagnoses: Sjogren's syndrome with other organ involvement (HCC) - Positive ANA, positive Ro, positive La, positive RF, sicca symptoms (dry mouth, dry eyes, dry skin, vaginal dryness): She continues to have dry mouth and dry eye symptoms.  She has been using over-the-counter products.  Some over-the-counter products were discussed.  She denies any increased shortness of breath or lymphadenopathy.  She is followed by Dr. Zina.  She has an appointment coming up in February.  June 30, 2024 UA negative, IFE normal, RF 76, sed rate 50, C3-C4 normal.  I had a detailed discussion with the patient regarding the lab work.  Sed rate remains elevated most likely due to bronchiectasis.  High risk medication use - Plaquenil  200 mg 1 tablet by mouth twice daily Monday through Friday. PLQ Eye Exam: 06/23/2024 she will have repeat labs prior to her next visit in 5 months.  Elevated sed rate -sed rate remains elevated.  Age-related osteoporosis without current pathological fracture - DEXA updated on 09/16/22: The BMD measured at Forearm Radius 33% is 0.469 g/cm2 with  a T-score of -4.6.  Patient is scheduled to have her first IV Reclast in September 2025 by her PCP.  Primary hypertension-blood pressure was 129/71.  She was recently placed on amlodipine  5 mg p.o. daily.  Chronic fatigue-improved.  Bronchiectasis without complication (HCC) - Followed by Dr. Kara.  High-resolution chest CT updated on 07/31/2023: No evidence of fibrotic interstitial lung disease.  Senile purpura (HCC)  Nodule of upper lobe of right lung  Abnormal SPEP - SPEP updated--no abnormal protein bands noted on  01/07/2024.  Other iron deficiency anemia  History of breast cancer  Purpura (HCC)  Family history of multiple sclerosis  Orders: No orders of the defined types were placed in this encounter.  No orders of the defined types were placed in this encounter.    Follow-Up Instructions: Return in about 5 months (around 12/07/2024) for Sjogren's.   Maya Nash, MD  Note - This record has been created using Animal nutritionist.  Chart creation errors have been sought, but may not always  have been located. Such creation errors do not reflect on  the standard of medical care.

## 2024-06-30 ENCOUNTER — Other Ambulatory Visit: Payer: Self-pay | Admitting: *Deleted

## 2024-06-30 DIAGNOSIS — M3509 Sicca syndrome with other organ involvement: Secondary | ICD-10-CM

## 2024-06-30 DIAGNOSIS — R700 Elevated erythrocyte sedimentation rate: Secondary | ICD-10-CM

## 2024-06-30 DIAGNOSIS — R778 Other specified abnormalities of plasma proteins: Secondary | ICD-10-CM

## 2024-06-30 DIAGNOSIS — Z79899 Other long term (current) drug therapy: Secondary | ICD-10-CM

## 2024-07-01 ENCOUNTER — Ambulatory Visit (HOSPITAL_BASED_OUTPATIENT_CLINIC_OR_DEPARTMENT_OTHER)
Admission: RE | Admit: 2024-07-01 | Discharge: 2024-07-01 | Disposition: A | Discharge status: Home / Self Care | Source: Ambulatory Visit | Attending: Internal Medicine | Admitting: Internal Medicine

## 2024-07-01 ENCOUNTER — Ambulatory Visit: Payer: Self-pay | Admitting: Rheumatology

## 2024-07-01 DIAGNOSIS — M818 Other osteoporosis without current pathological fracture: Secondary | ICD-10-CM | POA: Diagnosis present

## 2024-07-01 NOTE — Progress Notes (Signed)
 Patient has an appointment next week.  Will discuss results at the follow-up visit.

## 2024-07-01 NOTE — Progress Notes (Signed)
 Patient was clinically doing well at the last visit.  The labs do not indicate an autoimmune disease flare.  The sedimentation rate remains elevated.  Thank you for conveying the message to [facility].  Sed rate remains elevated, rheumatoid factor is elevated and stable, glucose is mildly elevated because not a fasting sample.  Sodium and chloride remains low.  WBC count and absolute lymphocyte count are low due to immunosuppression.  Hemoglobin is low.  UA negative, complements normal.  Labs do not indicate an autoimmune disease flare.  No change in treatment advised.

## 2024-07-05 ENCOUNTER — Ambulatory Visit (INDEPENDENT_AMBULATORY_CARE_PROVIDER_SITE_OTHER): Admitting: Student

## 2024-07-05 VITALS — BP 151/78 | HR 70 | Temp 97.4°F | Ht 69.0 in | Wt 119.8 lb

## 2024-07-05 DIAGNOSIS — Z1283 Encounter for screening for malignant neoplasm of skin: Secondary | ICD-10-CM

## 2024-07-05 DIAGNOSIS — I10 Essential (primary) hypertension: Secondary | ICD-10-CM

## 2024-07-05 DIAGNOSIS — M818 Other osteoporosis without current pathological fracture: Secondary | ICD-10-CM

## 2024-07-05 MED ORDER — AMLODIPINE BESYLATE 5 MG PO TABS: 5.0000 mg | ORAL_TABLET | Freq: Every day | ORAL | 11 refills | Status: DC

## 2024-07-05 NOTE — Progress Notes (Unsigned)
 Subjective:  CC: hypertension and osteoporosis  HPI:  [name] is a 78 y.o. female with a past medical history stated below and presents today for hypertension and osteoporosis. Please see problem based assessment and plan for additional details.  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     ferrous sulfate 325 (65 FE) MG EC tablet Take 325 mg by mouth daily with breakfast.     hydroxychloroquine  (PLAQUENIL ) 200 MG tablet TAKE ONE TABLET BY MOUTH TWICE A DAY MONDAY THROUGH FRIDAY 120 tablet 0   Multiple Vitamin (MULTIVITAMIN) tablet Take 1 tablet by mouth daily.     Omega-3 Fatty Acids (FISH OIL) 1000 MG CAPS Take by mouth.     No current facility-administered medications on file prior to visit.    Family History  Problem Relation Age of Onset   Multiple sclerosis Mother    Heart attack Father    Heart disease Father    Hodgkin's lymphoma Sister    Breast cancer Sister 24   Basal cell carcinoma Sister    Healthy Son     Social History   Socioeconomic History   Marital status: Widowed    Spouse name: Not on file   Number of children: Not on file   Years of education: Not on file   Highest education level: Not on file  Occupational History   Not on file  Tobacco Use   Smoking status: Former    Current packs/day: 0.00    Average packs/day: 1 pack/day for 4.0 years (4.0 ttl pk-yrs)    Types: Cigarettes    Start date: 46    Quit date: 32    Years since quitting: 53.7    Passive exposure: Past   Smokeless tobacco: Never  Vaping Use   Vaping status: Never Used  Substance and Sexual Activity   Alcohol use: Yes    Alcohol/week: 7.0  standard drinks of alcohol    Types: 7 Glasses of wine per week   Drug use: Never   Sexual activity: Yes    Partners: Male  Other Topics Concern   Not on file  Social History Narrative   Not on file   Social Drivers of Health   Financial Resource Strain: Low Risk  (09/15/2023)   Overall Financial Resource Strain (CARDIA)    Difficulty of Paying Living Expenses: Not hard at all  Food Insecurity: No Food Insecurity (09/15/2023)   Hunger Vital Sign    Worried About Running Out of Food in the Last Year: Never true    Ran Out of Food in the Last Year: Never true  Transportation Needs: No Transportation Needs (09/15/2023)   PRAPARE - Administrator, Civil Service (Medical): No    Lack of Transportation (Non-Medical): No  Physical Activity: Insufficiently Active (09/15/2023)   Exercise Vital Sign    Days of Exercise per Week: 3 days    Minutes of Exercise per Session: 20 min  Stress: Stress Concern Present (09/15/2023)   [employer] of Occupational Health - Occupational Stress Questionnaire    Feeling of Stress :  To some extent  Social Connections: Moderately Isolated (09/15/2023)   Social Connection and Isolation Panel    Frequency of Communication with Friends and Family: Twice a week    Frequency of Social Gatherings with Friends and Family: Three times a week    Attends Religious Services: Never    Active Member of Clubs or Organizations: No    Attends Banker Meetings: Never    Marital Status: Married  Catering manager Violence: Not At Risk (09/15/2023)   Humiliation, Afraid, Rape, and Kick questionnaire    Fear of Current or Ex-Partner: No    Emotionally Abused: No    Physically Abused: No    Sexually Abused: No    Review of Systems: ROS negative except for what is noted on the assessment and plan.  Objective:   Vitals:   07/05/24 1507 07/05/24 1602  BP: (!) 154/78 (!) 151/78  Pulse: 81 70  Temp: (!) 97.4 F (36.3 C)   TempSrc:  Oral   SpO2: 100%   Weight: 119 lb 12.8 oz (54.3 kg)   Height: 5' 9 (1.753 m)     Physical Exam: Constitutional: well-appearing elderly woman sitting in exam chair, in no acute distress HENT: normocephalic atraumatic, mucous membranes moist Eyes: conjunctiva non-erythematous; mouth without over ulceration. Moist Neck: supple Cardiovascular: regular rate and rhythm, no m/r/g, no LE edema Pulmonary/Chest: normal work of breathing on room air, lungs clear to auscultation bilaterally Abdominal: soft, non-tender, non-distended MSK: normal bulk and tone Neurological: alert & oriented x 3, normal up and go test, normal gait Skin: warm and dry Psych: Pleasant mood and affect     Assessment & Plan:   Primary hypertension Persistently elevated at prior office visits and today. Asymptomatic and with normal physical exam.  Vitals:   07/05/24 1507 07/05/24 1602  BP: (!) 154/78 (!) 151/78   Will choose to start treatment with amlodipine  5 mg daily. Discussed need for monitoring BP at home and noting if presyncopal or orthostatic symptoms.  - Amlodipine  5 mg  daily - Patient is to send me MyChart message with morning readings; goal <130/90 for her if tolerated - RTC in 3 months   Osteoporosis We were able to discuss this at length and review patients prior records now uplodaded to chart. She was on Alendronate in ~2010 for osteoporosis. Repeat Dexa scan in 2019 (at outside hospital) in osteopenic range. Repeat DEXA in 2023 with T-score at femoral neck was -3.3 and now it's -3.5.   She is at a high risk for major osteoporotic and hip fracture. Already on vitamin D  and calcium. Last year, vitamin D  in the normal range. Will refer for physical therapy to intensify strength training with assistance. She is independent, mobile, and active. Because of sjogren syndrome, patient has regular visits with dentist. No major work needs to be done and thus far her oral health is in good status.  We  discussed the risks and benefits. This patient would benefit from zoledronic acid infusions. Patient agrees with treatment. - Referral to bisphosphonate infusion - Will reassess DEXA in 2 years - Will re-engage in shared decision making at 2 year mark - Appreciate Bella Agent, [facility] for assistance ordering Reclast.    Return in about 3 months (around 10/04/2024) for With me for chronic condition follow up.  Patient discussed with Dr. Lovie Hadassah Kristy Rosario, MD [facility] Internal Medicine Residency Program

## 2024-07-05 NOTE — Patient Instructions (Addendum)
 Thank you, April Howell for allowing us  to provide your care today. Today we discussed   Osteoporosis: .- let me look more information about managing your osteoporosis - In the meantime, continue supplementing with 1000 international units of vitamin D  and Calcium  Referrals: - Dermatology clinic - [facility] to for strengthening - may be a good gateway to being established there   Blood pressure Vitals:   07/05/24 1507 07/05/24 1602  BP: (!) 154/78 (!) 151/78    It has been persistently elevated: l would like the top number to be less or around 140 and the bottom number less than 90 - I am starting a medication called Amlodipine  5 mg daily. Watch out for constipation and for mild lower extremity swelling (rare at this dose)  - BLOOD PRESSURE  Please check your blood pressure daily following the instructions below AND bring your readings to the next outpatient visit  Blood pressure tips   It is best to check your BP 1-2 hours after taking your medications to see the medications effectiveness on your BP.    Here are some tips that our clinical pharmacists share for home BP monitoring:          Rest 10 minutes before taking your blood pressure.          Don't smoke or drink caffeinated beverages for at least 30 minutes before.          Take your blood pressure before (not after) you eat.          Sit comfortably with your back supported and both feet on the floor (don't cross your legs).          Elevate your arm to heart level on a table or a desk.          Use the proper sized cuff. It should fit smoothly and snugly around your bare upper arm. There should be enough room to slip a fingertip under the cuff. The bottom edge of the cuff should be 1 inch above the crease of the elbow.     Take ALL your medications before you return to clinic   - Send me information about your b lood pressure in about 4 weeks thorough my chart. If persistently elevated,  make a sooner appointment.    My Chart Access: [email or web address]  Please follow-up in: 3 months with me for general follow up. Let's keep in touch through my chart.    We look forward to seeing you next time. Please call our clinic at [phone] if you have any questions or concerns. The best time to call is Monday-Friday from 9am-4pm, but there is someone available 24/7. If after hours or the weekend, call the main hospital number and ask for the Internal Medicine Resident On-Call. If you need medication refills, please notify your pharmacy one week in advance and they will send us  a request.   Thank you for letting us  take part in your care. Wishing you the best!  Elnora Ip, MD 07/05/2024, 4:01 PM [facility]

## 2024-07-07 ENCOUNTER — Encounter: Payer: Self-pay | Admitting: Student

## 2024-07-07 ENCOUNTER — Other Ambulatory Visit (HOSPITAL_COMMUNITY): Payer: Self-pay | Admitting: Student

## 2024-07-07 ENCOUNTER — Telehealth (HOSPITAL_COMMUNITY): Payer: Self-pay | Admitting: Pharmacy Technician

## 2024-07-07 ENCOUNTER — Ambulatory Visit: Attending: Rheumatology | Admitting: Rheumatology

## 2024-07-07 ENCOUNTER — Encounter: Payer: Self-pay | Admitting: Rheumatology

## 2024-07-07 VITALS — BP 129/71 | HR 71 | Temp 97.3°F | Resp 14 | Ht 69.0 in | Wt 119.2 lb

## 2024-07-07 DIAGNOSIS — M3509 Sicca syndrome with other organ involvement: Secondary | ICD-10-CM | POA: Diagnosis present

## 2024-07-07 DIAGNOSIS — I10 Essential (primary) hypertension: Secondary | ICD-10-CM | POA: Insufficient documentation

## 2024-07-07 DIAGNOSIS — Z820 Family history of epilepsy and other diseases of the nervous system: Secondary | ICD-10-CM | POA: Insufficient documentation

## 2024-07-07 DIAGNOSIS — R700 Elevated erythrocyte sedimentation rate: Secondary | ICD-10-CM | POA: Insufficient documentation

## 2024-07-07 DIAGNOSIS — D508 Other iron deficiency anemias: Secondary | ICD-10-CM | POA: Insufficient documentation

## 2024-07-07 DIAGNOSIS — D692 Other nonthrombocytopenic purpura: Secondary | ICD-10-CM | POA: Diagnosis present

## 2024-07-07 DIAGNOSIS — R5382 Chronic fatigue, unspecified: Secondary | ICD-10-CM | POA: Diagnosis present

## 2024-07-07 DIAGNOSIS — R911 Solitary pulmonary nodule: Secondary | ICD-10-CM | POA: Insufficient documentation

## 2024-07-07 DIAGNOSIS — Z79899 Other long term (current) drug therapy: Secondary | ICD-10-CM | POA: Insufficient documentation

## 2024-07-07 DIAGNOSIS — R778 Other specified abnormalities of plasma proteins: Secondary | ICD-10-CM | POA: Insufficient documentation

## 2024-07-07 DIAGNOSIS — M810 Age-related osteoporosis without current pathological fracture: Secondary | ICD-10-CM | POA: Diagnosis present

## 2024-07-07 DIAGNOSIS — Z853 Personal history of malignant neoplasm of breast: Secondary | ICD-10-CM | POA: Diagnosis present

## 2024-07-07 DIAGNOSIS — J479 Bronchiectasis, uncomplicated: Secondary | ICD-10-CM | POA: Insufficient documentation

## 2024-07-07 LAB — COMPREHENSIVE METABOLIC PANEL WITH GFR
AG Ratio: 1.1 (calc) (ref 1.0–2.5)
ALT: 15 U/L (ref 6–29)
AST: 24 U/L (ref 10–35)
Albumin: 4 g/dL (ref 3.6–5.1)
Alkaline phosphatase (APISO): 50 U/L (ref 37–153)
BUN: 14 mg/dL (ref 7–25)
CO2: 29 mmol/L (ref 20–32)
Calcium: 9 mg/dL (ref 8.6–10.4)
Chloride: 92 mmol/L — ABNORMAL LOW (ref 98–110)
Creat: 0.72 mg/dL (ref 0.60–1.00)
Globulin: 3.6 g/dL (ref 1.9–3.7)
Glucose, Bld: 112 mg/dL — ABNORMAL HIGH (ref 65–99)
Potassium: 4.4 mmol/L (ref 3.5–5.3)
Sodium: 126 mmol/L — ABNORMAL LOW (ref 135–146)
Total Bilirubin: 0.5 mg/dL (ref 0.2–1.2)
Total Protein: 7.6 g/dL (ref 6.1–8.1)
eGFR: 87 mL/min/1.73m2 (ref >=60)

## 2024-07-07 LAB — CBC WITH DIFFERENTIAL/PLATELET
Absolute Lymphocytes: 572 {cells}/uL — ABNORMAL LOW (ref 850–3900)
Absolute Monocytes: 371 {cells}/uL (ref 200–950)
Basophils Absolute: 29 {cells}/uL (ref 0–200)
Basophils Relative: 0.8 %
Eosinophils Absolute: 29 {cells}/uL (ref 15–500)
Eosinophils Relative: 0.8 %
HCT: 34.1 % — ABNORMAL LOW (ref 35.0–45.0)
Hemoglobin: 11.5 g/dL — ABNORMAL LOW (ref 11.7–15.5)
MCH: 32.9 pg (ref 27.0–33.0)
MCHC: 33.7 g/dL (ref 32.0–36.0)
MCV: 97.4 fL (ref 80.0–100.0)
MPV: 9 fL (ref 7.5–12.5)
Monocytes Relative: 10.3 %
Neutro Abs: 2599 {cells}/uL (ref 1500–7800)
Neutrophils Relative %: 72.2 %
Platelets: 242 Thousand/uL (ref 140–400)
RBC: 3.5 Million/uL — ABNORMAL LOW (ref 3.80–5.10)
RDW: 11.6 % (ref 11.0–15.0)
Total Lymphocyte: 15.9 %
WBC: 3.6 Thousand/uL — ABNORMAL LOW (ref 3.8–10.8)

## 2024-07-07 LAB — PROTEIN ELECTROPHORESIS, SERUM, WITH REFLEX
Albumin ELP: 4 g/dL (ref 3.8–4.8)
Alpha 1: 0.2 g/dL (ref 0.2–0.3)
Alpha 2: 0.7 g/dL (ref 0.5–0.9)
Beta 2: 0.4 g/dL (ref 0.2–0.5)
Beta Globulin: 0.4 g/dL (ref 0.4–0.6)
Gamma Globulin: 1.9 g/dL — ABNORMAL HIGH (ref 0.8–1.7)
Total Protein: 7.7 g/dL (ref 6.1–8.1)

## 2024-07-07 LAB — URINALYSIS, ROUTINE W REFLEX MICROSCOPIC
Bilirubin Urine: NEGATIVE
Glucose, UA: NEGATIVE
Hgb urine dipstick: NEGATIVE
Ketones, ur: NEGATIVE
Leukocytes,Ua: NEGATIVE
Nitrite: NEGATIVE
Protein, ur: NEGATIVE
Specific Gravity, Urine: 1.01 (ref 1.001–1.035)
pH: 7 (ref 5.0–8.0)

## 2024-07-07 LAB — C3 AND C4
C3 Complement: 117 mg/dL (ref 83–193)
C4 Complement: 19 mg/dL (ref 15–57)

## 2024-07-07 LAB — IFE INTERPRETATION

## 2024-07-07 LAB — RHEUMATOID FACTOR: Rheumatoid fact SerPl-aCnc: 76 [IU]/mL — ABNORMAL HIGH (ref <14)

## 2024-07-07 LAB — SEDIMENTATION RATE: Sed Rate: 50 mm/h — ABNORMAL HIGH (ref 0–30)

## 2024-07-07 NOTE — Patient Instructions (Signed)
 Standing Labs We placed an order today for your standing lab work.   Please have your standing labs drawn in February  Please have your labs drawn 2 weeks prior to your appointment so that the provider can discuss your lab results at your appointment, if possible.  Please note that you may see your imaging and lab results in MyChart before we have reviewed them. We will contact you once all results are reviewed. Please allow our office up to 72 hours to thoroughly review all of the results before contacting the office for clarification of your results.  WALK-IN LAB HOURS  Monday through Thursday from 8:00 am -12:30 pm and 1:00 pm-4:30 pm and Friday from 8:00 am-12:00 pm.  Patients with office visits requiring labs will be seen before walk-in labs.  You may encounter longer than normal wait times. Please allow additional time. Wait times may be shorter on  Monday and Thursday afternoons.  We do not book appointments for walk-in labs. We appreciate your patience and understanding with our staff.   Labs are drawn by [facility]. Please bring your co-pay at the time of your lab draw.  You may receive a bill from [facility] for your lab work.  Please note if you are on Hydroxychloroquine  and and an order has been placed for a Hydroxychloroquine  level,  you will need to have it drawn 4 hours or more after your last dose.  If you wish to have your labs drawn at another location, please call the office 24 hours in advance so we can fax the orders.  The office is located at [address], [place], [place] [address]   If you have any questions regarding directions or hours of operation,  please call [phone].   As a reminder, please drink plenty of water prior to coming for your lab work. Thanks!   Vaccines You are taking a medication(s) that can suppress your immune system.  The following immunizations are recommended: Flu annually Covid-19  Td/Tdap (tetanus, diphtheria, pertussis)  every 10 years Pneumonia (Prevnar 15 then Pneumovax 23 at least 1 year apart.  Alternatively, can take Prevnar 20 without needing additional dose) Shingrix: 2 doses from 4 weeks to 6 months apart  Please check with your PCP to make sure you are up to date.

## 2024-07-07 NOTE — Telephone Encounter (Signed)
 Auth Submission: NO AUTH NEEDED Site of care: [facility] Payer: Medicare A/B, [employer] Supp   Medication & CPT/J Code(s) submitted: Reclast (Zolendronic acid) J3489 Diagnosis Code: M81.0 Route of submission (phone, fax, portal):  Phone # Fax # Auth type: Buy/Bill PB Units/visits requested: 5mg  x 1 dose, q 12 months Reference number:  Approval from: 07/07/24 to 10/20/24    Dagoberto Armour, CPhT [facility] Phone: [phone] 07/07/2024

## 2024-07-07 NOTE — Assessment & Plan Note (Signed)
 Persistently elevated at prior office visits and today. Asymptomatic and with normal physical exam.  Vitals:   07/05/24 1507 07/05/24 1602  BP: (!) 154/78 (!) 151/78   Will choose to start treatment with amlodipine  5 mg daily. Discussed need for monitoring BP at home and noting if presyncopal or orthostatic symptoms.  - Amlodipine  5 mg  daily - Patient is to send me MyChart message with morning readings; goal <130/90 for her if tolerated - RTC in 3 months

## 2024-07-07 NOTE — Progress Notes (Signed)
 Internal Medicine Clinic Attending  Case discussed with the resident at the time of the visit.  We reviewed the resident's history and exam and pertinent patient test results.  I agree with the assessment, diagnosis, and plan of care documented in the resident's note.    Dr Kristy and I looked into April Howell's osteoporosis history quite a bit. There isn't a ton of data to guide her specific situation, but I do think she is at very high risk of a hip fracture, and her T scores have worsened when off bisphosphonates. I agree with plan to restart bisphosphonate, and agree with IV infusion given her symptomatic sjogren's syndrome

## 2024-07-07 NOTE — Assessment & Plan Note (Addendum)
 We were able to discuss this at length and review patients prior records now uplodaded to chart. She was on Alendronate in ~2010 for osteoporosis. Repeat Dexa scan in 2019 (at outside hospital) in osteopenic range. Repeat DEXA in 2023 with T-score at femoral neck was -3.3 and now it's -3.5.   She is at a high risk for major osteoporotic and hip fracture. Already on vitamin D  and calcium. Last year, vitamin D  in the normal range. Will refer for physical therapy to intensify strength training with assistance. She is independent, mobile, and active. Because of sjogren syndrome, patient has regular visits with dentist. No major work needs to be done and thus far her oral health is in good status.  We discussed the risks and benefits. This patient would benefit from zoledronic acid infusions. Patient agrees with treatment. - Referral to bisphosphonate infusion - Will reassess DEXA in 2 years - Will re-engage in shared decision making at 2 year mark - Appreciate Bella Agent, [facility] for assistance ordering Reclast.

## 2024-07-08 ENCOUNTER — Encounter: Admitting: Student

## 2024-07-12 ENCOUNTER — Ambulatory Visit: Admitting: Student

## 2024-07-14 ENCOUNTER — Ambulatory Visit (INDEPENDENT_AMBULATORY_CARE_PROVIDER_SITE_OTHER)

## 2024-07-14 VITALS — BP 134/77 | HR 74 | Temp 97.9°F | Resp 14 | Ht 69.0 in | Wt 118.2 lb

## 2024-07-14 DIAGNOSIS — M818 Other osteoporosis without current pathological fracture: Secondary | ICD-10-CM

## 2024-07-14 MED ORDER — ACETAMINOPHEN 325 MG PO TABS
650.0000 mg | ORAL_TABLET | Freq: Once | ORAL | Status: AC
  Administered 2024-07-14: 650 mg via ORAL
  Filled 2024-07-14: qty 2

## 2024-07-14 MED ORDER — ZOLEDRONIC ACID 5 MG/100ML IV SOLN
5.0000 mg | Freq: Once | INTRAVENOUS | Status: AC
  Administered 2024-07-14: 5 mg via INTRAVENOUS
  Filled 2024-07-14: qty 100

## 2024-07-14 MED ORDER — DIPHENHYDRAMINE HCL 25 MG PO CAPS
25.0000 mg | ORAL_CAPSULE | Freq: Once | ORAL | Status: AC
  Administered 2024-07-14: 25 mg via ORAL
  Filled 2024-07-14: qty 1

## 2024-07-14 NOTE — Progress Notes (Signed)
 Diagnosis: Osteoporosis  Provider:  Mannam, Praveen MD  Procedure: IV Infusion  IV Type: Peripheral, IV Location: L Forearm  Reclast  (Zolendronic Acid), Dose: 5 mg  Infusion Start Time: 1432  Infusion Stop Time: 1503  Post Infusion IV Care: Observation period completed and Peripheral IV Discontinued  Discharge: Condition: Stable, Destination: Home . AVS Provided  Performed by:  Rocky FORBES Sar, RN

## 2024-07-20 ENCOUNTER — Encounter (HOSPITAL_BASED_OUTPATIENT_CLINIC_OR_DEPARTMENT_OTHER): Payer: Self-pay | Admitting: Physical Therapy

## 2024-07-20 ENCOUNTER — Other Ambulatory Visit: Payer: Self-pay

## 2024-07-20 ENCOUNTER — Ambulatory Visit (HOSPITAL_BASED_OUTPATIENT_CLINIC_OR_DEPARTMENT_OTHER): Attending: Internal Medicine | Admitting: Physical Therapy

## 2024-07-20 DIAGNOSIS — M818 Other osteoporosis without current pathological fracture: Secondary | ICD-10-CM | POA: Insufficient documentation

## 2024-07-20 DIAGNOSIS — R293 Abnormal posture: Secondary | ICD-10-CM | POA: Diagnosis present

## 2024-07-20 NOTE — Therapy (Signed)
 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    Past Surgical History:  Procedure Laterality Date   CATARACT EXTRACTION, BILATERAL Bilateral    05/06/2022 and 05/27/2022   MASTECTOMY Right    Patient Active Problem List   Diagnosis Date Noted   Grief 01/29/2024   Leukopenia 01/29/2024   Hyponatremia 01/04/2022   Sjogren's syndrome 12/19/2021   Osteoporosis 12/19/2021   Breast cancer (HCC) 12/19/2021   Deficiency anemia 06/19/2021   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  Rationale for Evaluation and Treatment: Rehabilitation  ONSET DATE: Greater than 8 years  SUBJECTIVE:   SUBJECTIVE STATEMENT: Patient has a greater than 8-year history of osteoporosis.  She took Fosamax for several years.  She recently switched to Reclast  shots.  She continues to have osteoporosis.  She was sent to physical therapy for an osteoporosis program.  PERTINENT HISTORY: Breast cancer; basal cell carcinoma PAIN:  Are you having pain? No  PRECAUTIONS: None  RED FLAGS: None   WEIGHT BEARING RESTRICTIONS: No  FALLS:  Has patient fallen in last 6 months? No  LIVING ENVIRONMENT: No stairs in the house  OCCUPATION:  Retired   Hobbies:  Nothing at this time  PLOF: Independent  PATIENT  GOALS:  To develop an osteopetrosis program   NEXT MD VISIT:  Nothing at this time    OBJECTIVE:  Note: Objective measures were completed at Evaluation unless otherwise noted.  DIAGNOSTIC FINDINGS:  DEXA scan shows osteoporosis  PATIENT SURVEYS:  LEFS  Extreme difficulty/unable (0), Quite a bit of difficulty (1), Moderate difficulty (2), Little difficulty (3), No difficulty (4) Survey date:    Any of your usual work, housework or school activities   2. Usual hobbies, recreational or sporting activities   3. Getting into/out of the bath   4. Walking between rooms   5. Putting on socks/shoes   6. Squatting    7. Lifting an object, like a bag of groceries from the floor   8. Performing light activities around your home   9. Performing heavy activities around your home   10. Getting into/out of a car   11. Walking 2 blocks   12. Walking 1 mile   13. Going up/down 10 stairs (1 flight)   14. Standing for 1 hour   15.  sitting for 1 hour   16. Running on even ground   17. Running on uneven ground   18. Making sharp turns while running fast   19. Hopping    20. 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       POSTURE:  rounded shoulders and forward head  PALPATION: No significant tenderness to palpation     LOWER EXTREMITY MMT:  MMT Right eval Left eval  Hip flexion 21.7 16.2  Hip extension    Hip abduction 14.5 16.2  Hip adduction    Hip internal rotation    Hip external rotation    Knee flexion    Knee extension 25.2 23.4  Ankle dorsiflexion    Ankle plantarflexion    Ankle inversion    Ankle eversion     (Blank rows = not tested)   GAIT: No gait abnormalities                                                                                                                                 TREATMENT DATE:    Neuro-re-ed:   Row 2x12 red  Shoulder extension 2x12 red   March 2x10  Wall push up 2x10   PATIENT EDUCATION:  Education details: HEP, symptom management, idea behind osteopetrosis program  Person educated: Patient Education method: Explanation, Demonstration, Tactile cues, Verbal cues, and Handouts Education comprehension: verbalized understanding, returned demonstration, verbal cues required, tactile cues required, and needs further education  HOME EXERCISE PROGRAM: Access Code: [email or web address] URL: [email or web address] Date: 07/20/2024 Prepared by: Alm Don  Exercises - Shoulder extension with resistance - Neutral  - 1 x daily - 7 x weekly - 3 sets - 10 reps - Standing Bilateral Low Shoulder Row with Anchored Resistance  - 1 x daily - 7 x weekly - 3 sets - 10 reps - Wall Squat with Swiss Ball  - 1 x daily - 7 x weekly - 3 sets - 10 reps - Standing March with Counter Support  - 1 x daily - 7 x weekly - 3 sets - 10 reps - Wall Push Up  - 1 x daily - 7 x weekly - 3 sets - 10 reps  ASSESSMENT:  CLINICAL IMPRESSION: Patient is a 78 year old female with diagnosis of osteoporosis.  She comes in for an osteoporosis plan.  She presents with mild weakness in her legs.  She has mild postural abnormalities.  Otherwise she is healthy.  She was given base exercises to work on at home.  We will progress her to a gym program as tolerated.  We emphasize loading long bones and postural education.  OBJECTIVE IMPAIRMENTS: decreased strength, postural dysfunction, and osteopetrosis .   ACTIVITY LIMITATIONS: bending and standing  PARTICIPATION LIMITATIONS: community activity  PERSONAL FACTORS:  None   REHAB POTENTIAL: Good  CLINICAL DECISION MAKING: Stable/uncomplicated  EVALUATION COMPLEXITY: Low   GOALS: Goals reviewed with patient? Yes    LONG TERM GOALS: Target date: 09/14/2024    Patient will be independent with HEP for gym and home program to promote posture  and loading of long bones. Baseline:  Goal status: INITIAL  2.  Patient will increase gross bilateral lower extremity strength by 10 pounds in order to perform daily tasks  Baseline:  Goal status: INITIAL  3.  Patient will restate the importance of loading long bones and overall posture for osteoporosis Baseline:  Goal status: INITIAL  PLAN:  PT FREQUENCY: 1-2x/week  PT DURATION: 8 weeks  PLANNED INTERVENTIONS: 97110-Therapeutic exercises, 97530- Therapeutic activity, [id number]- Neuromuscular re-education, 97535- Self Care, [id number]- Manual therapy, [id number]- Gait training, [id number]- Aquatic Therapy, [id number]- Electrical stimulation (unattended), 97035- Ultrasound, Patient/Family education, Stair training, Taping, Dry Needling, DME instructions, Cryotherapy, and Moist heat   PLAN FOR NEXT SESSION:   Review current HEP.  Review gym exercises.  Continue to expand home exercises.  Consider supine series next visit if tolerated.  Alm JINNY Don, PT 07/20/2024, 2:29 PM

## 2024-07-21 ENCOUNTER — Encounter (HOSPITAL_BASED_OUTPATIENT_CLINIC_OR_DEPARTMENT_OTHER): Payer: Self-pay | Admitting: Physical Therapy

## 2024-07-27 ENCOUNTER — Ambulatory Visit (HOSPITAL_BASED_OUTPATIENT_CLINIC_OR_DEPARTMENT_OTHER): Attending: Internal Medicine | Admitting: Physical Therapy

## 2024-07-27 ENCOUNTER — Encounter (HOSPITAL_BASED_OUTPATIENT_CLINIC_OR_DEPARTMENT_OTHER): Payer: Self-pay | Admitting: Physical Therapy

## 2024-07-27 DIAGNOSIS — R293 Abnormal posture: Secondary | ICD-10-CM | POA: Diagnosis present

## 2024-07-27 NOTE — Therapy (Signed)
 OUTPATIENT PHYSICAL THERAPY LOWER EXTREMITY TREATMENT   Patient Name: April Howell MRN: [id number] DOB:01/17/1946, 78 y.o., female Today's Date: 07/28/2024  END OF SESSION:  PT End of Session -  07/24/2024      Visit Number 2   Number of Visits 16   Date for PT Re-Evaluation 09/14/2024   Authorization Type MCR A and B   PT Start Time  1114   PT Stop Time 1152   PT Time Calculation (min) 38 min   Activity Tolerance Patient tolerated treatment well   Behavior During Therapy  WFL for tasks assessed/performed      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   Osteopenia    Sjogren's syndrome    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    Past Surgical History:  Procedure Laterality Date   CATARACT EXTRACTION, BILATERAL Bilateral    05/06/2022 and 05/27/2022   MASTECTOMY Right    Patient Active Problem List   Diagnosis Date Noted   Grief 01/29/2024   Leukopenia 01/29/2024   Hyponatremia 01/04/2022   Sjogren's syndrome 12/19/2021   Osteoporosis 12/19/2021   Breast cancer (HCC) 12/19/2021   Deficiency anemia 06/19/2021   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  Pt reports compliance with HEP.  She has had no issues with HEP.  Pt states she is limited with lifting heavy objects.  Pt's son changes her fire alarms due to pt having difficulty with step  ladder.  Pt typically walks a couple of times per week about 15-20 mins.   Pt states she had a covid vaccination yesterday and is a little tired today.    She was sent to physical therapy for an osteoporosis program.  PERTINENT HISTORY: Breast cancer; basal cell carcinoma, Sjogren's syndrome  PAIN:  3/10 pain in bilat UT which is typical  PRECAUTIONS: None  RED FLAGS: None   WEIGHT BEARING RESTRICTIONS: No  FALLS:  Has patient fallen in last 6 months? No  LIVING ENVIRONMENT: No stairs in the house  OCCUPATION:  Retired   Hobbies:  Nothing at this time  PLOF: Independent  PATIENT GOALS:  To develop an osteopetrosis program   NEXT MD VISIT:  Nothing at this time    OBJECTIVE:  Note: Objective measures were completed at Evaluation unless otherwise noted.  DIAGNOSTIC FINDINGS:  DEXA scan shows osteoporosis  PATIENT SURVEYS:  LEFS  Extreme difficulty/unable (0), Quite a bit of difficulty (1), Moderate difficulty (2), Little difficulty (3), No difficulty (4) Survey date:    Any of your usual work, housework or school activities   2. Usual hobbies, recreational or sporting activities   3. Getting into/out of the bath   4. Walking between rooms   5. Putting on socks/shoes   6. Squatting    7. Lifting an object, like a bag of groceries from the floor   8. Performing  light activities around your home   9. Performing heavy activities around your home   10. Getting into/out of a car   11. Walking 2 blocks   12. Walking 1 mile   13. Going up/down 10 stairs (1 flight)   14. Standing for 1 hour   15.  sitting for 1 hour   16. Running on even ground   17. Running on uneven ground   18. Making sharp turns while running fast   19. Hopping    20. 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       POSTURE: rounded shoulders  and forward head  PALPATION: No significant tenderness to palpation     LOWER EXTREMITY MMT:  MMT Right eval Left eval  Hip flexion 21.7 16.2  Hip extension    Hip abduction 14.5 16.2  Hip adduction    Hip internal rotation    Hip external rotation    Knee flexion    Knee extension 25.2 23.4  Ankle dorsiflexion    Ankle plantarflexion    Ankle inversion    Ankle eversion     (Blank rows = not tested)   GAIT: No gait abnormalities                                                                                                                                 TREATMENT DATE:   07/27/24: Reviewed current function, pain level, response to prior treatment, and HEP compliance.  Reviewed HEP.   Supine bridge 3x10 Standing heel raises 2x10 Wall push ups 2x10 Standing rows with retraction with  RTB 2x12 Standing shoulder extension with retraction 2x12 with RTB Standing marching 2x10 Standing hip abd 2x10  Step ups x10, x5 reps with 1 UE support  Updated HEP.  PATIENT EDUCATION:  Education details: HEP, symptom management, idea behind osteopetrosis program  Person educated: Patient Education method: Explanation, Demonstration, Tactile cues, Verbal cues, and Handouts Education comprehension: verbalized understanding, returned demonstration, verbal cues required, tactile cues required, and needs further education  HOME EXERCISE PROGRAM: Access Code: [email or web address] URL: [email or web address] Date: 07/20/2024 Prepared by: Alm Don  Exercises - Shoulder extension with resistance - Neutral  - 1 x daily - 7 x weekly - 3 sets - 10 reps - Standing Bilateral Low Shoulder Row with Anchored Resistance  - 1 x daily - 7 x weekly - 3 sets - 10 reps - Wall Squat with Swiss Ball  - 1 x daily - 7 x weekly - 3 sets - 10 reps - Standing March with Counter Support  - 1 x daily - 7 x weekly - 3 sets - 10 reps - Wall Push Up  - 1 x daily - 7 x weekly - 3 sets - 10  reps  Updated HEP: - Supine Bridge  - 1 x daily - 7 x weekly - 2 sets - 10 reps - Heel Raises with Counter Support  - 1 x daily - 7 x weekly - 2 sets - 10 reps  ASSESSMENT:  CLINICAL IMPRESSION: Pt has been performing her HEP and reports good tolerance with HEP.  Pt performed exercises for improved strength and postural stabilization well with cuing and instruction in correct form and positioning.  PT reviewed and updated HEP.  Pt received a HEP handout and was educated in correct form and appropriate frequency.  She was fatigued at the end of treatment and states it's probably from her covid shot.  Pt responded well to treatment and had no pain after treatment.    OBJECTIVE IMPAIRMENTS: decreased strength, postural dysfunction, and osteopetrosis .   ACTIVITY LIMITATIONS: bending and standing  PARTICIPATION LIMITATIONS: community activity  PERSONAL FACTORS:  None   REHAB POTENTIAL: Good  CLINICAL DECISION MAKING: Stable/uncomplicated  EVALUATION COMPLEXITY: Low   GOALS: Goals reviewed with patient? Yes    LONG TERM GOALS: Target date: 09/14/2024    Patient will be independent with HEP for gym and home program to promote posture and loading of long bones. Baseline:  Goal status: INITIAL  2.  Patient will increase gross bilateral lower extremity strength by 10 pounds in order to perform daily tasks Baseline:  Goal status: INITIAL  3.  Patient will restate the importance of loading long bones and overall posture for osteoporosis Baseline:  Goal status: INITIAL  PLAN:  PT FREQUENCY: 1-2x/week  PT DURATION: 8 weeks  PLANNED INTERVENTIONS: 97110-Therapeutic exercises, 97530- Therapeutic activity, [id number]- Neuromuscular re-education, 97535- Self Care, [id number]- Manual therapy, [id number]- Gait training, [id number]- Aquatic Therapy, [id number]- Electrical stimulation (unattended), 97035- Ultrasound, Patient/Family education, Stair training, Taping, Dry Needling, DME instructions, Cryotherapy,  and Moist heat   PLAN FOR NEXT SESSION:   Review current HEP.  Review gym exercises.  Continue to expand home exercises.  Consider supine series next visit if tolerated.  Leigh Minerva III PT, DPT 07/28/24 9:03 PM

## 2024-07-30 ENCOUNTER — Ambulatory Visit (INDEPENDENT_AMBULATORY_CARE_PROVIDER_SITE_OTHER): Admitting: Adult Health

## 2024-07-30 ENCOUNTER — Ambulatory Visit: Payer: Self-pay | Admitting: Adult Health

## 2024-07-30 ENCOUNTER — Ambulatory Visit: Admitting: Adult Health

## 2024-07-30 ENCOUNTER — Ambulatory Visit (INDEPENDENT_AMBULATORY_CARE_PROVIDER_SITE_OTHER)

## 2024-07-30 ENCOUNTER — Encounter: Payer: Self-pay | Admitting: Adult Health

## 2024-07-30 VITALS — BP 136/71 | HR 73 | Temp 97.4°F | Ht 69.0 in | Wt 118.8 lb

## 2024-07-30 DIAGNOSIS — J471 Bronchiectasis with (acute) exacerbation: Secondary | ICD-10-CM

## 2024-07-30 DIAGNOSIS — J0100 Acute maxillary sinusitis, unspecified: Secondary | ICD-10-CM | POA: Diagnosis not present

## 2024-07-30 DIAGNOSIS — E871 Hypo-osmolality and hyponatremia: Secondary | ICD-10-CM | POA: Diagnosis not present

## 2024-07-30 DIAGNOSIS — R634 Abnormal weight loss: Secondary | ICD-10-CM

## 2024-07-30 DIAGNOSIS — E43 Unspecified severe protein-calorie malnutrition: Secondary | ICD-10-CM

## 2024-07-30 DIAGNOSIS — M3500 Sicca syndrome, unspecified: Secondary | ICD-10-CM

## 2024-07-30 MED ORDER — ALBUTEROL SULFATE (2.5 MG/3ML) 0.083% IN NEBU: 2.5000 mg | INHALATION_SOLUTION | Freq: Two times a day (BID) | RESPIRATORY_TRACT | 5 refills | Status: DC

## 2024-07-30 MED ORDER — FLUCONAZOLE 150 MG PO TABS: 150.0000 mg | ORAL_TABLET | Freq: Every day | ORAL | 0 refills | Status: DC

## 2024-07-30 MED ORDER — AMOXICILLIN-POT CLAVULANATE 875-125 MG PO TABS: 1.0000 | ORAL_TABLET | Freq: Two times a day (BID) | ORAL | 0 refills | Status: DC

## 2024-07-30 MED ORDER — ALBUTEROL SULFATE (2.5 MG/3ML) 0.083% IN NEBU
2.5000 mg | INHALATION_SOLUTION | Freq: Once | RESPIRATORY_TRACT | Status: AC
  Administered 2024-07-30: 2.5 mg via RESPIRATORY_TRACT

## 2024-07-30 NOTE — Progress Notes (Signed)
 @Patient  ID: April Howell, female    DOB: 1945/10/31, 78 y.o.   MRN: [id number]  Chief Complaint  Patient presents with   Follow-up    Bronchiectasis    Referring provider: Elnora Hadassah [facility]  HPI: 78 yo female former smoker followed for Bronchiectasis  Medical history significant for Sjogren's syndrome (on Plaquenil )-Rheumatology Devashar     TEST/EVENTS :  CT Chest 07/17/21 1. Right middle lobe, lingula and bilateral lower lobe bronchiectasis, peribronchovascular nodularity, mucoid impaction and scattered volume loss, findings typical of mycobacterium avium complex. This likely accounts for the questioned abnormality in the right upper lobe on recent chest radiograph. 2. 3 mm right solid pulmonary nodule within the upper lobe.  HRCT 07/2023 neg for ILD, bronchiectasis, mucoid impaction, vol loss, scattered nodularity-overall improved , c/w MAI   Discussed the use of AI scribe software for clinical note transcription with the patient, who gave verbal consent to proceed.  06/22/21 IgG 3,701 IgM 83 IgA 768  History of Present Illness April Howell is a 78 year old female with bronchiectasis who presents with worsening cough and congestion.  Over the past six months, she has experienced a worsening of respiratory symptoms, including a persistent cough and congestion with thick mucus production, along with sinus congestion and drainage predominantly affecting the right side of her sinuses. These symptoms temporarily improved with a previous extended course of Augmentin  2 years ago.   The need to clear mucus from her chest daily significantly impacts her ability to perform activities, including physical therapy for osteoporosis management. Lying back or tilting her head back, such as at the dentist or hairdresser, triggers coughing episodes.  She is not currently using nebulizers or inhalers due to finding the equipment cumbersome.  She has tried using a flutter valve but  was unable to figure out its use. She recalls being on Duoneb in the past but is not using it now.   She reports a weight loss of about ten pounds since January, despite maintaining her usual eating habits. No hemoptysis is noted. She continues to take Plaquenil  for Sjogren's syndrome and undergoes routine blood work for monitoring.  No joint swelling, rashes, leg swelling, or teeth pain. She confirms the presence of cough, congestion, thick mucus, and sinus 'junkiness'.  Mucus is dark green. She lives independently, is not on oxygen, and manages her daily activities without assistance. 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 Quit in 1972   Counseling given: Not Answered Tobacco comments: Smoked for about 4 years, 1ppd. Quit in 1972   Outpatient Medications Prior to Visit  Medication Sig Dispense Refill   amLODipine  (NORVASC ) 5 MG tablet Take 1 tablet (5  mg total) by mouth daily. 30 tablet 11   CALCIUM PO Take 650 mg by mouth daily. (Patient taking differently: Take 1,200 mg by mouth daily.)     ferrous sulfate 325 (65 FE) MG EC tablet Take 325 mg by mouth daily with breakfast.     hydroxychloroquine  (PLAQUENIL ) 200 MG tablet TAKE ONE TABLET BY MOUTH TWICE A DAY  MONDAY THROUGH FRIDAY 120 tablet 0   Multiple Vitamin (MULTIVITAMIN) tablet Take 1 tablet by mouth daily.     Omega-3 Fatty Acids (FISH OIL) 1000 MG CAPS Take by mouth.     No facility-administered medications prior to visit.     Review of Systems:   Constitutional:   +weight loss, no night sweats,  Fevers, chills, +fatigue, or  lassitude.  HEENT:   No headaches,  Difficulty swallowing,  Tooth/dental problems, or  Sore throat,                No sneezing, itching, ear ache, +nasal congestion, post nasal drip,   CV:  No chest pain,  Orthopnea, PND, swelling in lower extremities, anasarca, dizziness, palpitations, syncope.   GI  No heartburn, indigestion, abdominal pain, nausea, vomiting, diarrhea, change in bowel habits, loss of appetite, bloody stools.   Resp:   No chest wall deformity  Skin: no rash or lesions.  GU: no dysuria, change in color of urine, no urgency or frequency.  No flank pain, no hematuria   MS:  No joint pain or swelling.  No decreased range of motion.  No back pain.    Physical Exam  BP 136/71   Pulse 73   Temp (!) 97.4 F (36.3 C)   Ht 5' 9 (1.753 m)   Wt 118 lb 12.8 oz (53.9 kg)   SpO2 100% Comment: RA  BMI 17.54 kg/m   GEN: A/Ox3; pleasant , NAD, thin female    HEENT:  [place]/AT,    NOSE-clear, THROAT-clear, no lesions, no postnasal drip or exudate noted. Mild right sided max tenderness  NECK:  Supple w/ fair ROM; no JVD; normal carotid impulses w/o bruits; no thyromegaly or nodules palpated; no lymphadenopathy.    RESP  few trace rhonchi  no accessory muscle use, no dullness to percussion  CARD:  RRR, no m/r/g, no peripheral edema, pulses intact, no cyanosis or clubbing.  GI:   Soft & nt; nml bowel sounds; no organomegaly or masses detected.   Musco: Warm bil, no deformities or joint swelling noted.   Neuro: alert, no focal deficits noted.    Skin: Warm, no lesions or rashes    Lab Results:  CBC    Component Value Date/Time   WBC  3.6 (L) 06/30/2024 1444   RBC 3.50 (L) 06/30/2024 1444   HGB 11.5 (L) 06/30/2024 1444   HGB 12.0 04/07/2023 1430   HCT 34.1 (L) 06/30/2024 1444   PLT 242 06/30/2024 1444   PLT 224 04/07/2023 1430   MCV 97.4 06/30/2024 1444   MCH 32.9 06/30/2024 1444   MCHC 33.7 06/30/2024 1444   RDW 11.6 06/30/2024 1444   LYMPHSABS 0.7 04/07/2023 1430   MONOABS 0.3 04/07/2023 1430   EOSABS 29 06/30/2024 1444   BASOSABS 29 06/30/2024 1444    BMET    Component Value Date/Time   NA 126 (L) 06/30/2024 1444   NA 127 (L) 12/31/2021 1430   K 4.4 06/30/2024 1444   CL 92 (L) 06/30/2024 1444   CO2 29 06/30/2024 1444   GLUCOSE 112 (H) 06/30/2024 1444  BUN 14 06/30/2024 1444   BUN 17 12/31/2021 1430   CREATININE 0.72 06/30/2024 1444   CALCIUM 9.0 06/30/2024 1444   GFRNONAA >60 04/07/2023 1430    BNP No results found for: BNP  ProBNP No results found for: PROBNP  Imaging:        No data to display          No results found for: NITRICOXIDE      Assessment & Plan:   Assessment and Plan Assessment & Plan Bronchiectasis exacerbation  with acute right-sided sinus infection   Chronic bronchiectasis is exacerbated by a likely right-sided sinus infection, presenting with cough, congestion, and thick mucus. . Prescribe Augmentin  for 14 days and albuterol  nebulizer treatment twice daily. Provide a new flutter valve and demonstrate its use. Order a chest x-ray today. Instruct on using liquid Mucinex or Robitussin twice daily and advise saline nasal spray for sinus relief. Provide a Diflucan  prescription for potential yeast infection, to be used if needed. Encourage a high protein diet to counteract weight loss associated with bronchiectasis. Albuterol  neb given in office patient teaching on use of neb and flutter valve . Encouraged on mucociliary clearance. Chest xray pending.  Check Spirometry on return .   Unintentional weight loss   She has lost approximately 10 pounds since  January despite unchanged eating habits. This weight loss may be related to bronchiectasis with possible MAI  Emphasize the importance of maintaining muscle strength and protein intake. Encourage a high protein diet and continue physical therapy and strength training exercises.  Sjogren's syndrome   Sjogren's syndrome is managed with Plaquenil , and routine blood work is up to date. No new symptoms related to Sjogren's syndrome are reported.  Chronic hyponatremia- recent labs show ongoing low sodium level. Follow up with PCP next month for labs. No acute symptoms.   Plan  Patient Instructions  Begin Augmentin  875mg  Twice daily  for 2 weeks  Begin Albuterol  neb Twice daily   Begin Flutter valve Twice daily  after nebs  Liquid Mucinex 1 tsp Twice daily  As needed  cough/congestion  Diflucan  to have on hold if needed for yeast infection.  Chest xray today  Follow up in 6 weeks with Dr. Kara or Zamiyah Resendes NP and As needed   Please contact office for sooner follow up if symptoms do not improve or worsen or seek emergency care              Madelin Stank, NP 07/30/2024

## 2024-07-30 NOTE — Patient Instructions (Addendum)
 Begin Augmentin  875mg  Twice daily  for 2 weeks  Begin Albuterol  neb Twice daily   Begin Flutter valve Twice daily  after nebs  Liquid Mucinex 1 tsp Twice daily  As needed  cough/congestion  Diflucan  to have on hold if needed for yeast infection.  Chest xray today  Follow up in 6 weeks with Dr. Kara or Lesbia Ottaway NP and As needed   Please contact office for sooner follow up if symptoms do not improve or worsen or seek emergency care

## 2024-08-03 ENCOUNTER — Encounter: Payer: Self-pay | Admitting: Student

## 2024-08-03 ENCOUNTER — Ambulatory Visit (HOSPITAL_BASED_OUTPATIENT_CLINIC_OR_DEPARTMENT_OTHER): Admitting: Physical Therapy

## 2024-08-03 ENCOUNTER — Encounter (HOSPITAL_BASED_OUTPATIENT_CLINIC_OR_DEPARTMENT_OTHER): Payer: Self-pay | Admitting: Physical Therapy

## 2024-08-03 DIAGNOSIS — R293 Abnormal posture: Secondary | ICD-10-CM

## 2024-08-03 NOTE — Therapy (Unsigned)
 OUTPATIENT PHYSICAL THERAPY LOWER EXTREMITY TREATMENT   Patient Name: April Howell MRN: [id number] DOB:02-27-1946, 78 y.o., female Today's Date: 08/03/2024  END OF SESSION:  PT End of Session -  07/24/2024      Visit Number 2   Number of Visits 16   Date for PT Re-Evaluation 09/14/2024   Authorization Type MCR A and B   PT Start Time  1114   PT Stop Time 1152   PT Time Calculation (min) 38 min   Activity Tolerance Patient tolerated treatment well   Behavior During Therapy  WFL for tasks assessed/performed      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   Osteopenia    Sjogren's syndrome    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    Past Surgical History:  Procedure Laterality Date   CATARACT EXTRACTION, BILATERAL Bilateral    05/06/2022 and 05/27/2022   MASTECTOMY Right    Patient Active Problem List   Diagnosis Date Noted   Grief 01/29/2024   Leukopenia 01/29/2024   Hyponatremia 01/04/2022   Sjogren's syndrome 12/19/2021   Osteoporosis 12/19/2021   Breast cancer (HCC) 12/19/2021   Deficiency anemia 06/19/2021   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  Rationale for Evaluation and Treatment: Rehabilitation  ONSET DATE: Greater than 8 years  SUBJECTIVE:   SUBJECTIVE STATEMENT: The patient reports that after the last visit she had a day or two were her piriformis was hurting. She reports this is common and it went away.   PERTINENT HISTORY: Breast cancer; basal cell carcinoma, Sjogren's syndrome  PAIN:  3/10 pain in  bilat [place] which is typical  PRECAUTIONS: None  RED FLAGS: None   WEIGHT BEARING RESTRICTIONS: No  FALLS:  Has patient fallen in last 6 months? No  LIVING ENVIRONMENT: No stairs in the house  OCCUPATION:  Retired   Hobbies:  Nothing at this time  PLOF: Independent  PATIENT GOALS:  To develop an osteopetrosis program   NEXT MD VISIT:  Nothing at this time    OBJECTIVE:  Note: Objective measures were completed at Evaluation unless otherwise noted.  DIAGNOSTIC FINDINGS:  DEXA scan shows osteoporosis  PATIENT SURVEYS:  LEFS  Extreme difficulty/unable (0), Quite a bit of difficulty (1), Moderate difficulty (2), Little difficulty (3), No difficulty (4) Survey date:    Any of your usual work, housework or school activities   2. Usual hobbies, recreational or sporting activities   3. Getting into/out of the bath   4. Walking between rooms   5. Putting on socks/shoes   6. Squatting    7. Lifting an object, like a bag of groceries from the floor   8. Performing light activities around your home   9. Performing heavy activities around your home   10. Getting into/out of a car   11. Walking 2 blocks   12. Walking 1 mile   13. Going up/down 10 stairs (1 flight)   14. Standing for 1 hour   15.  sitting for 1 hour  16. Running on even ground   17. Running on uneven ground   18. Making sharp turns while running fast   19. Hopping    20. 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       POSTURE: rounded shoulders and forward head  PALPATION: No significant tenderness to palpation     LOWER EXTREMITY MMT:  MMT Right eval Left eval  Hip flexion 21.7 16.2  Hip extension    Hip abduction 14.5 16.2  Hip adduction    Hip internal rotation    Hip external rotation    Knee flexion    Knee extension 25.2 23.4  Ankle  dorsiflexion    Ankle plantarflexion    Ankle inversion    Ankle eversion     (Blank rows = not tested)   GAIT: No gait abnormalities                                                                                                                                 TREATMENT DATE:  10/15 Cybex leg press: 3x12 70 lbs third set 50 pounds with low RPE LF hip abduction 35 lbs  LF triceps press down first set 25 pounds RPE of 8 x12  2 x 12 15 lbs RPE of 5 Cybex Row 15 lbs RPE of 5  Reviewed set up of each machine  07/27/24: Reviewed current function, pain level, response to prior treatment, and HEP compliance.  Reviewed HEP.   Supine bridge 3x10 Standing heel raises 2x10 Wall push ups 2x10 Standing rows with retraction with  RTB 2x12 Standing shoulder extension with retraction 2x12 with RTB Standing marching 2x10 Standing hip abd 2x10  Step ups x10, x5 reps with 1 UE support  Updated HEP.  PATIENT EDUCATION:  Education details: HEP, symptom management, idea behind osteopetrosis program  Person educated: Patient Education method: Explanation, Demonstration, Tactile cues, Verbal cues, and Handouts Education comprehension: verbalized understanding, returned demonstration, verbal cues required, tactile cues required, and needs further education  HOME EXERCISE PROGRAM: Access Code: [email or web address] URL: [email or web address] Date: 07/20/2024 Prepared by: Alm Don  Exercises - Shoulder extension with resistance - Neutral  - 1 x daily - 7 x weekly - 3 sets - 10 reps - Standing Bilateral Low Shoulder Row with Anchored Resistance  - 1 x daily - 7 x weekly - 3 sets - 10 reps - Wall Squat with Swiss Ball  - 1 x daily - 7 x weekly - 3 sets - 10 reps - Standing March with Counter Support  - 1 x daily - 7 x weekly - 3 sets - 10 reps - Wall Push Up  - 1 x daily - 7 x weekly - 3 sets - 10 reps  Updated HEP: - Supine Bridge  -  1 x daily - 7 x weekly - 2 sets - 10 reps -  Heel Raises with Counter Support  - 1 x daily - 7 x weekly - 2 sets - 10 reps  ASSESSMENT:  CLINICAL IMPRESSION: Therapy brought patient into the gym to work on exercises for loading her bones as well as posture.  She tolerated well.  We worked on grading her exercises.  We had to adjust her tricep press down.  She otherwise is able to keep her exercises within RPE of 5-6.  We will continue to expand gym program as tolerated..    OBJECTIVE IMPAIRMENTS: decreased strength, postural dysfunction, and osteopetrosis .   ACTIVITY LIMITATIONS: bending and standing  PARTICIPATION LIMITATIONS: community activity  PERSONAL FACTORS:  None   REHAB POTENTIAL: Good  CLINICAL DECISION MAKING: Stable/uncomplicated  EVALUATION COMPLEXITY: Low   GOALS: Goals reviewed with patient? Yes    LONG TERM GOALS: Target date: 09/14/2024    Patient will be independent with HEP for gym and home program to promote posture and loading of long bones. Baseline:  Goal status: INITIAL  2.  Patient will increase gross bilateral lower extremity strength by 10 pounds in order to perform daily tasks Baseline:  Goal status: INITIAL  3.  Patient will restate the importance of loading long bones and overall posture for osteoporosis Baseline:  Goal status: INITIAL  PLAN:  PT FREQUENCY: 1-2x/week  PT DURATION: 8 weeks  PLANNED INTERVENTIONS: 97110-Therapeutic exercises, 97530- Therapeutic activity, [id number]- Neuromuscular re-education, 97535- Self Care, [id number]- Manual therapy, [id number]- Gait training, [id number]- Aquatic Therapy, 97014- Electrical stimulation (unattended), 97035- Ultrasound, Patient/Family education, Stair training, Taping, Dry Needling, DME instructions, Cryotherapy, and Moist heat   PLAN FOR NEXT SESSION:   Review current HEP.  Review gym exercises.  Continue to expand home exercises.  Consider supine series next visit if tolerated.  Leigh Minerva III PT, DPT 08/03/24 3:16 PM

## 2024-08-11 ENCOUNTER — Ambulatory Visit (HOSPITAL_BASED_OUTPATIENT_CLINIC_OR_DEPARTMENT_OTHER): Payer: Self-pay | Admitting: Physical Therapy

## 2024-08-11 DIAGNOSIS — R293 Abnormal posture: Secondary | ICD-10-CM

## 2024-08-11 NOTE — Therapy (Signed)
 OUTPATIENT PHYSICAL THERAPY LOWER EXTREMITY TREATMENT   Patient Name: April Howell MRN: [id number] DOB:1946/07/11, 78 y.o., female Today's Date: 08/12/2024  END OF SESSION:  PT End of Session - 08/11/24 1500     Visit Number 4    Number of Visits 16    Date for Recertification  09/14/24    Authorization Type MCR A and B    PT Start Time 1459    PT Stop Time 1534    PT Time Calculation (min) 35 min    Activity Tolerance Patient tolerated treatment well    Behavior During Therapy 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    Past Surgical History:  Procedure Laterality Date   CATARACT EXTRACTION, BILATERAL Bilateral    05/06/2022 and 05/27/2022   MASTECTOMY Right    Patient Active Problem List   Diagnosis Date Noted   Grief 01/29/2024   Leukopenia 01/29/2024   Hyponatremia 01/04/2022   Sjogren's syndrome 12/19/2021   Osteoporosis 12/19/2021   Breast cancer (HCC) 12/19/2021   Deficiency anemia 06/19/2021   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  Pt reports she was having pain in her piriformis before prior treatment.  She states the pain comes and goes.  She hasn't felt any piriformis pain since prior treatment.  Pt  denies pain currently.  Pt reports compliance with HEP.   PERTINENT HISTORY: Breast cancer; basal cell carcinoma, Sjogren's syndrome   PAIN:  0/10 pain  PRECAUTIONS: None  RED FLAGS: None   WEIGHT BEARING RESTRICTIONS: No  FALLS:  Has patient fallen in last 6 months? No  LIVING ENVIRONMENT: No stairs in the house  OCCUPATION:  Retired   Hobbies:  Nothing at this time  PLOF: Independent  PATIENT GOALS:  To develop an osteopetrosis program   NEXT MD VISIT:  Nothing at this time    OBJECTIVE:  Note: Objective measures were completed at Evaluation unless otherwise noted.  DIAGNOSTIC FINDINGS:  DEXA scan shows osteoporosis  PATIENT SURVEYS:  LEFS  Extreme difficulty/unable (0), Quite a bit of difficulty (1), Moderate difficulty (2), Little difficulty (3), No difficulty (4) Survey date:    Any of your usual work, housework or school activities   2. Usual hobbies, recreational or sporting activities   3. Getting into/out of the bath   4. Walking between rooms   5. Putting on socks/shoes   6. Squatting    7. Lifting an object, like a bag of groceries from the floor   8. Performing light activities around your home   9. Performing heavy activities around your home   10. Getting into/out of a car   11. Walking 2 blocks  12. Walking 1 mile   13. Going up/down 10 stairs (1 flight)   14. Standing for 1 hour   15.  sitting for 1 hour   16. Running on even ground   17. Running on uneven ground   18. Making sharp turns while running fast   19. Hopping    20. 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       POSTURE: rounded shoulders and forward head  PALPATION: No significant tenderness to palpation     LOWER EXTREMITY MMT:  MMT Right eval Left eval  Hip flexion 21.7 16.2  Hip extension    Hip abduction  14.5 16.2  Hip adduction    Hip internal rotation    Hip external rotation    Knee flexion    Knee extension 25.2 23.4  Ankle dorsiflexion    Ankle plantarflexion    Ankle inversion    Ankle eversion     (Blank rows = not tested)   GAIT: No gait abnormalities                                                                                                                                 TREATMENT DATE:  10/22 Supine bridge 3x10 Standing heel raises 2x10 Marching on airex 2x10 with UE support LF hip abd 35 lbs  2x10 Cybex leg press 3x12  55# Standing hip abduction 2x10 Step ups 6 inch step approx 10-12 reps with UE support LF seated Row 10 lbs  2x10    10/15 Cybex leg press: 3x12 70 lbs third set 50 pounds with low RPE LF hip abduction 35 lbs  LF triceps press down first set 25 pounds RPE of 8 x12  2 x 12 15 lbs RPE of 5 Cybex Row 15 lbs RPE of 5  Reviewed set up of each machine  07/27/24: Reviewed current function, pain level, response to prior treatment, and HEP compliance.  Reviewed HEP.   Supine bridge 3x10 Standing heel raises 2x10 Wall push ups 2x10 Standing rows with retraction with  RTB 2x12 Standing shoulder extension with retraction 2x12 with RTB Standing marching 2x10 Standing hip abd 2x10  Step ups x10, x5 reps with 1 UE support  Updated HEP.  PATIENT EDUCATION:  Education details: HEP, symptom management, idea behind osteopetrosis program  Person educated: Patient Education method: Explanation, Demonstration, Tactile cues, Verbal cues, and Handouts Education comprehension: verbalized understanding, returned demonstration, verbal cues required, tactile cues required, and needs further education  HOME EXERCISE PROGRAM: Access Code: [email or web address] URL: [email or web address] Date: 07/20/2024 Prepared by: Alm Don  Exercises - Shoulder extension with resistance - Neutral  - 1 x daily - 7 x weekly - 3 sets - 10 reps - Standing  Bilateral Low Shoulder Row with Anchored  Resistance  - 1 x daily - 7 x weekly - 3 sets - 10 reps - Wall Squat with Swiss Ball  - 1 x daily - 7 x weekly - 3 sets - 10 reps - Standing March with Counter Support  - 1 x daily - 7 x weekly - 3 sets - 10 reps - Wall Push Up  - 1 x daily - 7 x weekly - 3 sets - 10 reps  Updated HEP: - Supine Bridge  - 1 x daily - 7 x weekly - 2 sets - 10 reps - Heel Raises with Counter Support  - 1 x daily - 7 x weekly - 2 sets - 10 reps  ASSESSMENT:  CLINICAL IMPRESSION: PT performed exercises for improved strength and postural stabilization and for proper loading of bones.  She performed gym exercises with PT instruction in correct form and proper set up of gym equipment.  She tolerated exercises well and was moderately fatigued after treatment.  She denies any pain after treatment.  Pt should benefit from cont skilled PT to address goals and improve strength and overall function.       OBJECTIVE IMPAIRMENTS: decreased strength, postural dysfunction, and osteopetrosis .   ACTIVITY LIMITATIONS: bending and standing  PARTICIPATION LIMITATIONS: community activity  PERSONAL FACTORS:  None   REHAB POTENTIAL: Good  CLINICAL DECISION MAKING: Stable/uncomplicated  EVALUATION COMPLEXITY: Low   GOALS: Goals reviewed with patient? Yes    LONG TERM GOALS: Target date: 09/14/2024    Patient will be independent with HEP for gym and home program to promote posture and loading of long bones. Baseline:  Goal status: INITIAL  2.  Patient will increase gross bilateral lower extremity strength by 10 pounds in order to perform daily tasks Baseline:  Goal status: INITIAL  3.  Patient will restate the importance of loading long bones and overall posture for osteoporosis Baseline:  Goal status: INITIAL  PLAN:  PT FREQUENCY: 1-2x/week  PT DURATION: 8 weeks  PLANNED INTERVENTIONS: 97110-Therapeutic exercises, 97530- Therapeutic activity, [id number]- Neuromuscular  re-education, 97535- Self Care, [id number]- Manual therapy, [id number]- Gait training, [id number]- Aquatic Therapy, 97014- Electrical stimulation (unattended), 97035- Ultrasound, Patient/Family education, Stair training, Taping, Dry Needling, DME instructions, Cryotherapy, and Moist heat   PLAN FOR NEXT SESSION:   Review current HEP.  Review gym exercises.  Continue to expand home exercises.  Consider supine series next visit if tolerated.  Leigh Minerva III PT, DPT 08/12/24 11:21 PM

## 2024-08-12 ENCOUNTER — Encounter (HOSPITAL_BASED_OUTPATIENT_CLINIC_OR_DEPARTMENT_OTHER): Payer: Self-pay | Admitting: Physical Therapy

## 2024-08-18 ENCOUNTER — Encounter (HOSPITAL_BASED_OUTPATIENT_CLINIC_OR_DEPARTMENT_OTHER): Payer: Self-pay | Admitting: Physical Therapy

## 2024-08-18 ENCOUNTER — Ambulatory Visit (HOSPITAL_BASED_OUTPATIENT_CLINIC_OR_DEPARTMENT_OTHER): Admitting: Physical Therapy

## 2024-08-18 DIAGNOSIS — R293 Abnormal posture: Secondary | ICD-10-CM | POA: Diagnosis not present

## 2024-08-18 NOTE — Therapy (Signed)
 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   Osteopenia    Sjogren's syndrome    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    Past Surgical History:  Procedure Laterality Date   CATARACT EXTRACTION, BILATERAL Bilateral    05/06/2022 and 05/27/2022   MASTECTOMY Right    Patient Active Problem List   Diagnosis Date Noted   Grief 01/29/2024   Leukopenia 01/29/2024   Hyponatremia 01/04/2022   Sjogren's syndrome 12/19/2021   Osteoporosis 12/19/2021   Breast cancer (HCC) 12/19/2021   Deficiency anemia 06/19/2021   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 She had no pain after the last visit.  PERTINENT HISTORY: Breast cancer; basal cell carcinoma, Sjogren's syndrome   PAIN:  0/10 pain  PRECAUTIONS: None  RED FLAGS: None   WEIGHT BEARING RESTRICTIONS: No  FALLS:  Has  patient fallen in last 6 months? No  LIVING ENVIRONMENT: No stairs in the house  OCCUPATION:  Retired   Hobbies:  Nothing at this time  PLOF: Independent  PATIENT GOALS:  To develop an osteopetrosis program   NEXT MD VISIT:  Nothing at this time    OBJECTIVE:  Note: Objective measures were completed at Evaluation unless otherwise noted.  DIAGNOSTIC FINDINGS:  DEXA scan shows osteoporosis  PATIENT SURVEYS:  LEFS  Extreme difficulty/unable (0), Quite a bit of difficulty (1), Moderate difficulty (2), Little difficulty (3), No difficulty (4) Survey date:    Any of your usual work, housework or school activities   2. Usual hobbies, recreational or sporting activities   3. Getting into/out of the bath   4. Walking between rooms   5. Putting on socks/shoes   6. Squatting    7. Lifting an object, like a bag of groceries from the floor   8. Performing light activities around your home   9. Performing heavy activities around your home   10. Getting into/out of a car   11. Walking 2 blocks   12. Walking 1 mile   13. Going up/down 10 stairs (1 flight)   14. Standing for 1 hour   15.  sitting for 1 hour   16. Running on even ground   17. Running on uneven ground   18.  Making sharp turns while running fast   19. Hopping    20. 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       POSTURE: rounded shoulders and forward head  PALPATION: No significant tenderness to palpation     LOWER EXTREMITY MMT:  MMT Right eval Left eval  Hip flexion 21.7 16.2  Hip extension    Hip abduction 14.5 16.2  Hip adduction    Hip internal rotation    Hip external rotation    Knee flexion    Knee extension 25.2 23.4  Ankle dorsiflexion    Ankle plantarflexion    Ankle inversion    Ankle eversion     (Blank rows = not  tested)   GAIT: No gait abnormalities                                                                                                                                 TREATMENT DATE:  10/29 There-ex:  Recumbent bike 5 min L2  Leg press LF 25 lbs 3x12  Hamstring curl 3x12 10 lbs  Neuro-re-ed:   In mirror:  Shoulder flexion 2x10 2lbs  Shoulder scaption 2x10 1lb  Shoulder press 2x10 2 lbs  Shoulder punch seated 2x10   10/22 Supine bridge 3x10 Standing heel raises 2x10 Marching on airex 2x10 with UE support LF hip abd 35 lbs  2x10 Cybex leg press 3x12  55# Standing hip abduction 2x10 Step ups 6 inch step approx 10-12 reps with UE support LF seated Row 10 lbs  2x10    10/15 Cybex leg press: 3x12 70 lbs third set 50 pounds with low RPE LF hip abduction 35 lbs  LF triceps press down first set 25 pounds RPE of 8 x12  2 x 12 15 lbs RPE of 5 Cybex Row 15 lbs RPE of 5  Reviewed set up of each machine  07/27/24: Reviewed current function, pain level, response to prior treatment, and HEP compliance.  Reviewed HEP.   Supine bridge 3x10 Standing heel raises 2x10 Wall push ups 2x10 Standing rows with retraction with  RTB 2x12 Standing shoulder extension with retraction 2x12 with RTB Standing marching 2x10 Standing hip abd 2x10  Step ups x10, x5 reps with 1 UE support  Updated HEP.  PATIENT EDUCATION:  Education details: HEP, symptom management, idea behind osteopetrosis program  Person educated: Patient Education method: Explanation, Demonstration, Tactile cues, Verbal cues, and Handouts Education comprehension: verbalized understanding, returned demonstration, verbal cues required, tactile cues required, and needs further education  HOME EXERCISE PROGRAM: Access Code: [email or web address] URL: [email or web address] Date: 07/20/2024 Prepared by: Alm Don  Exercises - Shoulder extension with resistance - Neutral  - 1 x daily - 7 x weekly - 3 sets - 10  reps - Standing Bilateral  Low Shoulder Row with Anchored Resistance  - 1 x daily - 7 x weekly - 3 sets - 10 reps - Wall Squat with Swiss Ball  - 1 x daily - 7 x weekly - 3 sets - 10 reps - Standing March with Counter Support  - 1 x daily - 7 x weekly - 3 sets - 10 reps - Wall Push Up  - 1 x daily - 7 x weekly - 3 sets - 10 reps  Updated HEP: - Supine Bridge  - 1 x daily - 7 x weekly - 2 sets - 10 reps - Heel Raises with Counter Support  - 1 x daily - 7 x weekly - 2 sets - 10 reps  ASSESSMENT:  CLINICAL IMPRESSION: Therapy continues to expand the patients exercises. She had no pain. We continue to discuss grading her exercises. We continue to follow RPE.       OBJECTIVE IMPAIRMENTS: decreased strength, postural dysfunction, and osteopetrosis .   ACTIVITY LIMITATIONS: bending and standing  PARTICIPATION LIMITATIONS: community activity  PERSONAL FACTORS:  None   REHAB POTENTIAL: Good  CLINICAL DECISION MAKING: Stable/uncomplicated  EVALUATION COMPLEXITY: Low   GOALS: Goals reviewed with patient? Yes    LONG TERM GOALS: Target date: 09/14/2024    Patient will be independent with HEP for gym and home program to promote posture and loading of long bones. Baseline:  Goal status: INITIAL  2.  Patient will increase gross bilateral lower extremity strength by 10 pounds in order to perform daily tasks Baseline:  Goal status: INITIAL  3.  Patient will restate the importance of loading long bones and overall posture for osteoporosis Baseline:  Goal status: INITIAL  PLAN:  PT FREQUENCY: 1-2x/week  PT DURATION: 8 weeks  PLANNED INTERVENTIONS: 97110-Therapeutic exercises, 97530- Therapeutic activity, [id number]- Neuromuscular re-education, 97535- Self Care, 02859- Manual therapy, [id number]- Gait training, [id number]- Aquatic Therapy, 97014- Electrical stimulation (unattended), 97035- Ultrasound, Patient/Family education, Stair training, Taping, Dry Needling, DME instructions, Cryotherapy,  and Moist heat   PLAN FOR NEXT SESSION:   Review current HEP.  Review gym exercises.  Continue to expand home exercises.  Consider supine series next visit if tolerated.  Alm Don PT DPT 08/18/24 1:08 PM

## 2024-08-25 ENCOUNTER — Encounter (HOSPITAL_BASED_OUTPATIENT_CLINIC_OR_DEPARTMENT_OTHER): Admitting: Physical Therapy

## 2024-08-27 ENCOUNTER — Other Ambulatory Visit: Payer: Self-pay | Admitting: Physician Assistant

## 2024-08-27 NOTE — Telephone Encounter (Signed)
 Last Fill: 06/04/2024  Eye exam: 06/23/2024 WNL    Labs: 06/30/2024 Sed rate remains elevated, rheumatoid factor is elevated and stable, glucose is mildly elevated because not a fasting sample. Sodium and chloride remains low. WBC count and absolute lymphocyte count are low due to immunosuppression. Hemoglobin is low. UA negative, complements normal. Labs do not indicate an autoimmune disease flare. No change in treatment advised.   Next Visit: 12/07/2024  Last Visit: 07/07/2024  IK:Dgnhmzw'd syndrome with other organ involvement   Current Dose per office note 07/07/2024: Plaquenil  200 mg 1 tablet by mouth twice daily Monday through Friday  Okay to refill Plaquenil ?

## 2024-09-01 ENCOUNTER — Ambulatory Visit (HOSPITAL_BASED_OUTPATIENT_CLINIC_OR_DEPARTMENT_OTHER): Attending: Internal Medicine | Admitting: Physical Therapy

## 2024-09-01 ENCOUNTER — Encounter (HOSPITAL_BASED_OUTPATIENT_CLINIC_OR_DEPARTMENT_OTHER): Payer: Self-pay | Admitting: Physical Therapy

## 2024-09-01 DIAGNOSIS — R293 Abnormal posture: Secondary | ICD-10-CM | POA: Insufficient documentation

## 2024-09-01 NOTE — Therapy (Signed)
 OUTPATIENT PHYSICAL THERAPY LOWER EXTREMITY TREATMENT   Patient Name: April Howell MRN: [id number] DOB:1946/02/16, 78 y.o., female Today's Date: 09/01/2024  END OF SESSION:  PT End of Session - 09/01/24 1521     Visit Number 6    Number of Visits 16    Date for Recertification  09/14/24    Authorization Type MCR A and B    PT Start Time 1515    PT Stop Time 1558    PT Time Calculation (min) 43 min    Activity Tolerance Patient tolerated treatment well    Behavior During Therapy [facility] for tasks assessed/performed              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   Osteopenia    Sjogren's syndrome    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    Past Surgical History:  Procedure Laterality Date   CATARACT EXTRACTION, BILATERAL Bilateral    05/06/2022 and 05/27/2022   MASTECTOMY Right    Patient Active Problem List   Diagnosis Date Noted   Grief 01/29/2024   Leukopenia 01/29/2024   Hyponatremia 01/04/2022   Sjogren's syndrome 12/19/2021   Osteoporosis 12/19/2021   Breast cancer (HCC) 12/19/2021   Deficiency anemia 06/19/2021   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 She had no pain after the last visit.  PERTINENT HISTORY: Breast cancer; basal cell carcinoma, Sjogren's syndrome   PAIN:  0/10 pain  PRECAUTIONS: None  RED FLAGS: None   WEIGHT  BEARING RESTRICTIONS: No  FALLS:  Has patient fallen in last 6 months? No  LIVING ENVIRONMENT: No stairs in the house  OCCUPATION:  Retired   Hobbies:  Nothing at this time  PLOF: Independent  PATIENT GOALS:  To develop an osteopetrosis program   NEXT MD VISIT:  Nothing at this time    OBJECTIVE:  Note: Objective measures were completed at Evaluation unless otherwise noted.  DIAGNOSTIC FINDINGS:  DEXA scan shows osteoporosis  PATIENT SURVEYS:  LEFS  Extreme difficulty/unable (0), Quite a bit of difficulty (1), Moderate difficulty (2), Little difficulty (3), No difficulty (4) Survey date:    Any of your usual work, housework or school activities   2. Usual hobbies, recreational or sporting activities   3. Getting into/out of the bath   4. Walking between rooms   5. Putting on socks/shoes   6. Squatting    7. Lifting an object, like a bag of groceries from the floor   8. Performing light activities around your home   9. Performing heavy activities around your home   10. Getting into/out of a car   11. Walking 2 blocks   12. Walking 1 mile   13. Going up/down 10 stairs (1 flight)   14. Standing for 1 hour   15.  sitting for 1 hour   16. Running on even ground  17. Running on uneven ground   18. Making sharp turns while running fast   19. Hopping    20. 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       POSTURE: rounded shoulders and forward head  PALPATION: No significant tenderness to palpation     LOWER EXTREMITY MMT:  MMT Right eval Left eval  Hip flexion 21.7 16.2  Hip extension    Hip abduction 14.5 16.2  Hip adduction    Hip internal rotation    Hip external rotation    Knee flexion    Knee extension 25.2 23.4  Ankle dorsiflexion    Ankle plantarflexion    Ankle inversion    Ankle eversion      (Blank rows = not tested)   GAIT: No gait abnormalities                                                                                                                                 TREATMENT DATE:  11/12 Cybex row 3x10 30 lbs  LF Leg press 15 lbs 3x12  Nu-step 5 min with self set up  Cybex Triceps 3x12 10 lbs 3x12  Cybex chest press 3x12 0 lbs    10/29 There-ex:  Recumbent bike 5 min L2  Leg press LF 25 lbs 3x12  Hamstring curl 3x12 10 lbs  Neuro-re-ed:   In mirror:  Shoulder flexion 2x10 2lbs  Shoulder scaption 2x10 1lb  Shoulder press 2x10 2 lbs  Shoulder punch seated 2x10   10/22 Supine bridge 3x10 Standing heel raises 2x10 Marching on airex 2x10 with UE support LF hip abd 35 lbs  2x10 Cybex leg press 3x12  55# Standing hip abduction 2x10 Step ups 6 inch step approx 10-12 reps with UE support LF seated Row 10 lbs  2x10    10/15 Cybex leg press: 3x12 70 lbs third set 50 pounds with low RPE LF hip abduction 35 lbs  LF triceps press down first set 25 pounds RPE of 8 x12  2 x 12 15 lbs RPE of 5 Cybex Row 15 lbs RPE of 5  Reviewed set up of each machine  07/27/24: Reviewed current function, pain level, response to prior treatment, and HEP compliance.  Reviewed HEP.   Supine bridge 3x10 Standing heel raises 2x10 Wall push ups 2x10 Standing rows with retraction with  RTB 2x12 Standing shoulder extension with retraction 2x12 with RTB Standing marching 2x10 Standing hip abd 2x10  Step ups x10, x5 reps with 1 UE support  Updated HEP.  PATIENT EDUCATION:  Education details: HEP, symptom management, idea behind osteopetrosis program  Person educated: Patient Education method: Explanation, Demonstration, Tactile cues, Verbal cues, and Handouts Education comprehension: verbalized understanding, returned demonstration, verbal cues required, tactile cues required, and  needs further education  HOME EXERCISE PROGRAM: Access Code: [email or web address] URL:  [email or web address] Date: 07/20/2024 Prepared by: Alm Don  Exercises - Shoulder extension with resistance - Neutral  - 1 x daily - 7 x weekly - 3 sets - 10 reps - Standing Bilateral Low Shoulder Row with Anchored Resistance  - 1 x daily - 7 x weekly - 3 sets - 10 reps - Wall Squat with Swiss Ball  - 1 x daily - 7 x weekly - 3 sets - 10 reps - Standing March with Counter Support  - 1 x daily - 7 x weekly - 3 sets - 10 reps - Wall Push Up  - 1 x daily - 7 x weekly - 3 sets - 10 reps  Updated HEP: - Supine Bridge  - 1 x daily - 7 x weekly - 2 sets - 10 reps - Heel Raises with Counter Support  - 1 x daily - 7 x weekly - 2 sets - 10 reps  ASSESSMENT:  CLINICAL IMPRESSION: The patients tolerated gym exercises well. She did well adjusting her own weights. She was given a sheets with all her weights. We reviewed her options going forward. We will continue to progress as tolerated.      OBJECTIVE IMPAIRMENTS: decreased strength, postural dysfunction, and osteopetrosis .   ACTIVITY LIMITATIONS: bending and standing  PARTICIPATION LIMITATIONS: community activity  PERSONAL FACTORS:  None   REHAB POTENTIAL: Good  CLINICAL DECISION MAKING: Stable/uncomplicated  EVALUATION COMPLEXITY: Low   GOALS: Goals reviewed with patient? Yes    LONG TERM GOALS: Target date: 09/14/2024    Patient will be independent with HEP for gym and home program to promote posture and loading of long bones. Baseline:  Goal status: INITIAL  2.  Patient will increase gross bilateral lower extremity strength by 10 pounds in order to perform daily tasks Baseline:  Goal status: INITIAL  3.  Patient will restate the importance of loading long bones and overall posture for osteoporosis Baseline:  Goal status: INITIAL  PLAN:  PT FREQUENCY: 1-2x/week  PT DURATION: 8 weeks  PLANNED INTERVENTIONS: 97110-Therapeutic exercises, 97530- Therapeutic activity, [id number]- Neuromuscular  re-education, 97535- Self Care, [id number]- Manual therapy, [id number]- Gait training, [id number]- Aquatic Therapy, 97014- Electrical stimulation (unattended), 97035- Ultrasound, Patient/Family education, Stair training, Taping, Dry Needling, DME instructions, Cryotherapy, and Moist heat   PLAN FOR NEXT SESSION:   Review current HEP.  Review gym exercises.  Continue to expand home exercises.  Consider supine series next visit if tolerated.  Alm Don PT DPT 09/01/24 3:22 PM

## 2024-09-10 ENCOUNTER — Ambulatory Visit

## 2024-09-10 DIAGNOSIS — J471 Bronchiectasis with (acute) exacerbation: Secondary | ICD-10-CM | POA: Diagnosis not present

## 2024-09-10 LAB — PULMONARY FUNCTION TEST
DL/VA % pred: 78 %
DL/VA: 3.08 ml/min/mmHg/L
DLCO unc % pred: 63 %
DLCO unc: 14.33 ml/min/mmHg
FEF 25-75 Pre: 1.58 L/s
FEF2575-%Pred-Pre: 83 %
FEV1-%Pred-Pre: 77 %
FEV1-Pre: 2.02 L
FEV1FVC-%Pred-Pre: 95 %
FEV6-%Pred-Pre: 81 %
FEV6-Pre: 2.67 L
FEV6FVC-%Pred-Pre: 104 %
FVC-%Pred-Pre: 83 %
FVC-Pre: 2.85 L
Pre FEV1/FVC ratio: 71 %
Pre FEV6/FVC Ratio: 100 %
RV % pred: 110 %
RV: 2.87 L
TLC % pred: 94 %
TLC: 5.55 L

## 2024-09-10 NOTE — Progress Notes (Signed)
 Full pft without post spiro performed today

## 2024-09-10 NOTE — Patient Instructions (Signed)
 Full pft without post spiro performed today

## 2024-09-20 ENCOUNTER — Encounter: Payer: Self-pay | Admitting: Adult Health

## 2024-09-20 ENCOUNTER — Ambulatory Visit: Admitting: Adult Health

## 2024-09-20 VITALS — BP 132/72 | HR 73 | Temp 97.5°F | Ht 69.0 in | Wt 125.8 lb

## 2024-09-20 DIAGNOSIS — M810 Age-related osteoporosis without current pathological fracture: Secondary | ICD-10-CM | POA: Diagnosis not present

## 2024-09-20 DIAGNOSIS — E440 Moderate protein-calorie malnutrition: Secondary | ICD-10-CM | POA: Diagnosis not present

## 2024-09-20 DIAGNOSIS — R5381 Other malaise: Secondary | ICD-10-CM

## 2024-09-20 DIAGNOSIS — J479 Bronchiectasis, uncomplicated: Secondary | ICD-10-CM | POA: Diagnosis not present

## 2024-09-20 MED ORDER — ALBUTEROL SULFATE (2.5 MG/3ML) 0.083% IN NEBU: 2.5000 mg | INHALATION_SOLUTION | Freq: Four times a day (QID) | RESPIRATORY_TRACT | 5 refills | Status: AC | PRN

## 2024-09-20 NOTE — Patient Instructions (Addendum)
 May Use Albuterol  neb As needed   Use Flutter valve Twice daily  after nebs As needed   Liquid Mucinex 1 tsp Twice daily  As needed  cough/congestion  Follow up in 6 months with Dr. Kara or Delainy Mcelhiney NP and As needed   Please contact office for sooner follow up if symptoms do not improve or worsen or seek emergency care

## 2024-09-20 NOTE — Progress Notes (Signed)
 @Patient  ID: April Howell, female    DOB: January 18, 1946, 78 y.o.   MRN: [id number]  Chief Complaint  Patient presents with   Follow-up    Bronchiectasis and PFT f/u    Referring provider: Orlie Madelin RAMAN, NP  HPI: 78 yo female former smoker followed for Bronchiectasis  Medical history significant for Sjogren's syndrome followed by rheumatology on Plaquenil     TEST/EVENTS : Reviewed 09/20/2024  CT Chest 07/17/21 1. Right middle lobe, lingula and bilateral lower lobe bronchiectasis, peribronchovascular nodularity, mucoid impaction and scattered volume loss, findings typical of mycobacterium avium complex. This likely accounts for the questioned abnormality in the right upper lobe on recent chest radiograph. 2. 3 mm right solid pulmonary nodule within the upper lobe.   HRCT 07/2023 neg for ILD, bronchiectasis, mucoid impaction, vol loss, scattered nodularity-overall improved , c/w MAI   06/22/21 IgG 3,701 IgM 83 IgA 768   Discussed the use of AI scribe software for clinical note transcription with the patient, who gave verbal consent to proceed.  History of Present Illness April Howell is a 78 year old female with bronchiectasis who presents for follow-up after a recent bronchiectatic flare.  She experienced a recent flare of bronchiectasis, with increased cough and congestion along with sinus congestion and drainage which was managed with Augmentin  for 2 week, resulting in significant improvement. She now feels 'one hundred percent' better and no longer has any chest congestion. She was provided with a flutter valve, which helped while she was having a flare but currently does not feel she needs to use it as she has no cough or congestion.  Last visit was started on albuterol  nebulizer treatment twice daily.  Chest x-ray showed stable bronchiectatic changes.  Patient was set up for pulmonary function testing that shows minimum restriction and decreased diffusing capacity.     Last visit was having some weight loss and decreased appetite.  She was recommended to start eating yogurt daily.  Patient says her appetite is improved and she is actually up 7 pounds.   She is participating in physical therapy for osteoporosis, focusing on weight training and strengthening exercises, and plans to join a gym program at [facility] after completing physical therapy.  She has received her flu and COVID vaccinations.     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 Quit in 1972   Counseling given: Not Answered Tobacco comments: Smoked for about 4 years, 1ppd. Quit in 1972   Outpatient Medications Prior to Visit  Medication Sig Dispense Refill   albuterol  (PROVENTIL ) (2.5 MG/3ML) 0.083% nebulizer solution Take 3 mLs (2.5 mg total) by nebulization 2 (two) times daily. 75 mL 5   amLODipine  (NORVASC ) 5 MG tablet Take  1 tablet (5 mg total) by mouth daily. 30 tablet 11   CALCIUM PO Take 650 mg by mouth daily. (Patient taking differently: Take 1,200 mg by mouth daily.)     ferrous sulfate 325 (65 FE) MG EC tablet Take 325 mg by mouth daily with breakfast.     hydroxychloroquine  (PLAQUENIL ) 200 MG tablet TAKE ONE TABLET BY MOUTH TWICE A DAY MONDAY THROUGH FRIDAY 120 tablet 0    Multiple Vitamin (MULTIVITAMIN) tablet Take 1 tablet by mouth daily.     Omega-3 Fatty Acids (FISH OIL) 1000 MG CAPS Take by mouth.     amoxicillin -clavulanate (AUGMENTIN ) 875-125 MG tablet Take 1 tablet by mouth 2 (two) times daily. 28 tablet 0   fluconazole  (DIFLUCAN ) 150 MG tablet Take 1 tablet (150 mg total) by mouth daily. 1 tablet 0   No facility-administered medications prior to visit.     Review of Systems:   Constitutional:   No  weight loss, night sweats,  Fevers, chills, fatigue, or  lassitude.  HEENT:   No headaches,  Difficulty swallowing,  Tooth/dental problems, or  Sore throat,                No sneezing, itching, ear ache, nasal congestion, post nasal drip,   CV:  No chest pain,  Orthopnea, PND, swelling in lower extremities, anasarca, dizziness, palpitations, syncope.   GI  No heartburn, indigestion, abdominal pain, nausea, vomiting, diarrhea, change in bowel habits, loss of appetite, bloody stools.   Resp: No shortness of breath with exertion or at rest.  No excess mucus, no productive cough,  No non-productive cough,  No coughing up of blood.  No change in color of mucus.  No wheezing.  No chest wall deformity  Skin: no rash or lesions.  GU: no dysuria, change in color of urine, no urgency or frequency.  No flank pain, no hematuria   MS:  No joint pain or swelling.  No decreased range of motion.  No back pain.    Physical Exam  BP 132/72   Pulse 73   Temp (!) 97.5 F (36.4 C)   Ht 5' 9 (1.753 m) Comment: Per pt  Wt 125 lb 12.8 oz (57.1 kg)   SpO2 100% Comment: RA  BMI 18.58 kg/m   GEN: A/Ox3; pleasant , NAD, thin   HEENT:  [place]/AT,   NOSE-clear, THROAT-clear, no lesions, no postnasal drip or exudate noted.   NECK:  Supple w/ fair ROM; no JVD; normal carotid impulses w/o bruits; no thyromegaly or nodules palpated; no lymphadenopathy.    RESP  Clear  P & A; w/o, wheezes/ rales/ or rhonchi. no accessory muscle use, no dullness to percussion  CARD:   RRR, no m/r/g, no peripheral edema, pulses intact, no cyanosis or clubbing.  GI:   Soft & nt; nml bowel sounds; no organomegaly or masses detected.   Musco: Warm bil, no deformities or joint swelling noted.   Neuro: alert, no focal deficits noted.    Skin: Warm, no lesions or rashes    Lab Results:Reviewed 09/20/2024   CBC    BNP No results found for: BNP  ProBNP No results found for: PROBNP  Imaging: No results found.  albuterol  (PROVENTIL ) (2.5 MG/3ML) 0.083% nebulizer solution 2.5 mg     Date Action Dose Route User   07/30/2024 1129 Given 2.5 mg Nebulization Heater, Megan S, CMA          Latest Ref Rng & Units 09/10/2024   10:47 AM  PFT Results  FVC-Pre L 2.85  P  FVC-Predicted Pre % 83  P  Pre FEV1/FVC % % 71  P  FEV1-Pre L 2.02  P  FEV1-Predicted Pre % 77  P  DLCO uncorrected ml/min/mmHg 14.33  P  DLCO UNC% % 63  P  DLVA Predicted % 78  P  TLC L 5.55  P  TLC % Predicted % 94  P  RV % Predicted % 110  P    P Preliminary result    No results found for: NITRICOXIDE      No data to display              Assessment & Plan:   Assessment and Plan Assessment & Plan Bronchiectasis  -recent exacerbation now resolved. Patient is clinically improved after prolonged course of antibiotics with Augmentin  for 2 weeks. Chest x-ray confirmed bronchiectasis without pneumonia. Pulmonary function test showed minimal restriction and decreased diffusion capacity, with no significant airflow obstruction.  Change albuterol  nebulizer to as needed use Flutter Valve as needed during flare-ups  Use liquid Mucinex as needed for cough or congestion. Continue activity as tolerated.  Protein calorie malnutrition-moderate -weight is improving.  Continue on high-protein diet.  Activity and exercise as able.  Osteoporosis  -physical deconditioning She is currently in physical therapy focusing on weight training and strengthening exercises. Plans to transition to a  gym program post-therapy to maintain muscle strength and improve endurance. Continue physical therapy and transition to a gym program post-therapy.   Plan  Patient Instructions  May Use Albuterol  neb As needed   Use Flutter valve Twice daily  after nebs As needed   Liquid Mucinex 1 tsp Twice daily  As needed  cough/congestion  Follow up in 6 months with Dr. Kara or Merville Hijazi NP and As needed   Please contact office for sooner follow up if symptoms do not improve or worsen or seek emergency care          Madelin Stank, NP 09/20/2024

## 2024-09-21 ENCOUNTER — Other Ambulatory Visit: Payer: Self-pay | Admitting: *Deleted

## 2024-09-21 DIAGNOSIS — J479 Bronchiectasis, uncomplicated: Secondary | ICD-10-CM

## 2024-11-09 ENCOUNTER — Ambulatory Visit (INDEPENDENT_AMBULATORY_CARE_PROVIDER_SITE_OTHER): Payer: Self-pay

## 2024-11-09 ENCOUNTER — Other Ambulatory Visit: Payer: Self-pay

## 2024-11-09 VITALS — BP 133/65 | HR 80 | Temp 98.1°F | Ht 69.0 in | Wt 129.8 lb

## 2024-11-09 DIAGNOSIS — M810 Age-related osteoporosis without current pathological fracture: Secondary | ICD-10-CM | POA: Diagnosis not present

## 2024-11-09 DIAGNOSIS — M818 Other osteoporosis without current pathological fracture: Secondary | ICD-10-CM

## 2024-11-09 DIAGNOSIS — I10 Essential (primary) hypertension: Secondary | ICD-10-CM

## 2024-11-09 DIAGNOSIS — Z1231 Encounter for screening mammogram for malignant neoplasm of breast: Secondary | ICD-10-CM | POA: Diagnosis not present

## 2024-11-09 DIAGNOSIS — Z79899 Other long term (current) drug therapy: Secondary | ICD-10-CM

## 2024-11-09 MED ORDER — AMLODIPINE BESYLATE 5 MG PO TABS: 5.0000 mg | ORAL_TABLET | Freq: Every day | ORAL | 3 refills | Status: AC

## 2024-11-09 NOTE — Assessment & Plan Note (Signed)
 Patient has history of breast of right breast, s/p mastectomy. No acute concerns today. Last mammogram was in June 2025 and did not show any concerns for malignancy. Patient no longer follows with oncology.   Plan: Order placed for Mammogram due in June 2026

## 2024-11-09 NOTE — Patient Instructions (Signed)
 Today we discussed the following medical conditions and plan:   I am glad that your blood pressure is doing well. Let us  know if you notice any lightheadedness or dizziness. Or if there are any low blood pressures.   We look forward to seeing you next time. Please call our clinic at [phone] if you have any questions or concerns. The best time to call is Monday-Friday from 9am-4pm, but there is someone available 24/7. If you need medication refills, please notify your pharmacy one week in advance and they will send us  a request.   Thank you for trusting me with your care. Wishing you the best!   April Nowakowski D'Mello, DO  [facility]  Follow up in about 4 months with Dr. Kristy

## 2024-11-09 NOTE — Assessment & Plan Note (Signed)
 Patient had sent BP values to Dr. Kristy. Some low SBPs noted with systolics in the 90s. Patient states that since then average blood pressure has been around 110/70. She denies any lightheadedness/dizziness.    Plan: Continue amlodipine  5 mg  Contact clinic with any concerns of low blood pressures Continue to check BP intermittently

## 2024-11-09 NOTE — Assessment & Plan Note (Signed)
 Was previously on alendronate around 2010. DEXA score improved while on it but most recent T score of femoral neck is -3.5. No recent history of falls. No plans for any upcoming dental work. Tolerated infusion well.   Plan:  Next bisophosphonate infusion due in September 2026 Repeat Dexa in September 2027

## 2024-11-09 NOTE — Progress Notes (Signed)
 mamm  Established Patient Office Visit  Subjective   Patient ID: ILIANA HUTT, female    DOB: 1946/07/28  Age: 79 y.o. MRN: [id number]  Chief Complaint  Patient presents with   Follow-up    HPI 79 year old female with PMH of sjogrens syndrome, leukopenia, osteoporosis, HTN, anemia, right breast malignancy s/p mastectomy in remission  that presents today for follow up of chronic conditions. See problem based plan and assessment for more details.    ROS See problem based plan and assessment for more details.    Objective:     BP 133/65 (BP Location: Left Arm, Patient Position: Sitting, Cuff Size: Normal)   Pulse 80   Temp 98.1 F (36.7 C) (Oral)   Ht 5' 9 (1.753 m)   Wt 129 lb 12.8 oz (58.9 kg)   SpO2 100%   BMI 19.17 kg/m  BP Readings from Last 3 Encounters:  11/09/24 133/65  09/20/24 132/72  07/30/24 136/71      Physical Exam Constitutional:      Appearance: Normal appearance.  Cardiovascular:     Rate and Rhythm: Normal rate and regular rhythm.     Heart sounds: No murmur heard. Pulmonary:     Effort: Pulmonary effort is normal. No respiratory distress.     Breath sounds: No wheezing.  Neurological:     Mental Status: She is alert and oriented to person, place, and time.  Psychiatric:        Mood and Affect: Mood normal.      No results found for any visits on 11/09/24.    The ASCVD Risk score (Arnett DK, et al., 2019) failed to calculate for the following reasons:   Cannot find a previous HDL lab   Cannot find a previous total cholesterol lab   * - Cholesterol units were assumed    Assessment & Plan:   Problem List Items Addressed This Visit       Cardiovascular and Mediastinum   Primary hypertension   Patient had sent BP values to Dr. Kristy. Some low SBPs noted with systolics in the 90s. Patient states that since then average blood pressure has been around 110/70. She denies any lightheadedness/dizziness.    Plan: Continue amlodipine  5  mg  Contact clinic with any concerns of low blood pressures Continue to check BP intermittently         Musculoskeletal and Integument   Osteoporosis (Chronic)   Was previously on alendronate around 2010. DEXA score improved while on it but most recent T score of femoral neck is -3.5. No recent history of falls. No plans for any upcoming dental work. Tolerated infusion well.   Plan:  Next bisophosphonate infusion due in September 2026 Repeat Dexa in September 2027         Other   Screening mammogram for breast cancer - Primary   Patient has history of breast of right breast, s/p mastectomy. No acute concerns today. Last mammogram was in June 2025 and did not show any concerns for malignancy. Patient no longer follows with oncology.   Plan: Order placed for Mammogram due in June 2026       Relevant Orders   MM 3D SCREENING MAMMOGRAM UNILATERAL LEFT BREAST    Return in about 4 months (around 03/09/2025) for follow up of chronic conditions .    Glenisha Gundry D'Mello, DO Patient discussed with Dr. Lovie

## 2024-11-10 NOTE — Progress Notes (Signed)
 Internal Medicine Clinic Attending  Case discussed with the resident at the time of the visit.  We reviewed the resident's history and exam and pertinent patient test results.  I agree with the assessment, diagnosis, and plan of care documented in the resident's note.

## 2024-11-20 ENCOUNTER — Other Ambulatory Visit: Payer: Self-pay | Admitting: Rheumatology

## 2024-12-07 ENCOUNTER — Ambulatory Visit: Admitting: Physician Assistant

## 2024-12-07 DIAGNOSIS — I10 Essential (primary) hypertension: Secondary | ICD-10-CM

## 2024-12-07 DIAGNOSIS — M810 Age-related osteoporosis without current pathological fracture: Secondary | ICD-10-CM

## 2024-12-07 DIAGNOSIS — R5382 Chronic fatigue, unspecified: Secondary | ICD-10-CM

## 2024-12-07 DIAGNOSIS — D692 Other nonthrombocytopenic purpura: Secondary | ICD-10-CM

## 2024-12-07 DIAGNOSIS — R700 Elevated erythrocyte sedimentation rate: Secondary | ICD-10-CM

## 2024-12-07 DIAGNOSIS — Z820 Family history of epilepsy and other diseases of the nervous system: Secondary | ICD-10-CM

## 2024-12-07 DIAGNOSIS — M3509 Sicca syndrome with other organ involvement: Secondary | ICD-10-CM

## 2024-12-07 DIAGNOSIS — J479 Bronchiectasis, uncomplicated: Secondary | ICD-10-CM

## 2024-12-07 DIAGNOSIS — R911 Solitary pulmonary nodule: Secondary | ICD-10-CM

## 2024-12-07 DIAGNOSIS — R778 Other specified abnormalities of plasma proteins: Secondary | ICD-10-CM

## 2024-12-07 DIAGNOSIS — Z853 Personal history of malignant neoplasm of breast: Secondary | ICD-10-CM

## 2024-12-07 DIAGNOSIS — Z79899 Other long term (current) drug therapy: Secondary | ICD-10-CM

## 2024-12-07 DIAGNOSIS — D508 Other iron deficiency anemias: Secondary | ICD-10-CM

## 2024-12-15 ENCOUNTER — Ambulatory Visit: Payer: Self-pay

## 2025-01-25 ENCOUNTER — Ambulatory Visit: Admitting: Dermatology

## 2025-02-21 ENCOUNTER — Ambulatory Visit: Admitting: Pulmonary Disease

## 2025-03-16 ENCOUNTER — Ambulatory Visit: Payer: Self-pay | Admitting: Student
